# Patient Record
Sex: Female | Born: 1965 | Race: White | Hispanic: No | Smoking: Former smoker
Health system: Southern US, Community
[De-identification: ages and names within clinical notes are randomized; demographics above are authoritative.]

## PROBLEM LIST (undated history)

## (undated) DIAGNOSIS — F319 Bipolar disorder, unspecified: Secondary | ICD-10-CM

## (undated) DIAGNOSIS — F32A Depression, unspecified: Secondary | ICD-10-CM

## (undated) DIAGNOSIS — F419 Anxiety disorder, unspecified: Secondary | ICD-10-CM

## (undated) DIAGNOSIS — F329 Major depressive disorder, single episode, unspecified: Secondary | ICD-10-CM

## (undated) DIAGNOSIS — F909 Attention-deficit hyperactivity disorder, unspecified type: Secondary | ICD-10-CM

## (undated) HISTORY — PX: APPENDECTOMY: SHX54

## (undated) HISTORY — DX: Major depressive disorder, single episode, unspecified: F32.9

## (undated) HISTORY — DX: Bipolar disorder, unspecified: F31.9

## (undated) HISTORY — DX: Anxiety disorder, unspecified: F41.9

## (undated) HISTORY — DX: Depression, unspecified: F32.A

## (undated) HISTORY — DX: Attention-deficit hyperactivity disorder, unspecified type: F90.9

---

## 2001-12-24 ENCOUNTER — Encounter: Payer: Self-pay | Admitting: Emergency Medicine

## 2001-12-24 ENCOUNTER — Emergency Department (HOSPITAL_COMMUNITY): Admission: EM | Admit: 2001-12-24 | Discharge: 2001-12-24 | Payer: Self-pay | Admitting: Emergency Medicine

## 2002-01-03 ENCOUNTER — Emergency Department (HOSPITAL_COMMUNITY): Admission: EM | Admit: 2002-01-03 | Discharge: 2002-01-03 | Payer: Self-pay | Admitting: Emergency Medicine

## 2004-05-06 ENCOUNTER — Ambulatory Visit: Payer: Self-pay | Admitting: Internal Medicine

## 2004-06-21 ENCOUNTER — Emergency Department: Payer: Self-pay | Admitting: Emergency Medicine

## 2004-06-21 IMAGING — CT CT ABD-PELV W/ CM
1 of 2 series · 15 of 32 positions shown, 19 images · non-contrast
Comparison: none

REASON FOR EXAM: Abdominal pain. IV and oral contrast. Patient in rm 11
COMMENTS:

[Series 2: appendicitis · axial · 0.71mm/px · z∈[-586,-186]mm · 15 of 145 slices shown, 19 images]
[im 6/145  soft-tissue]
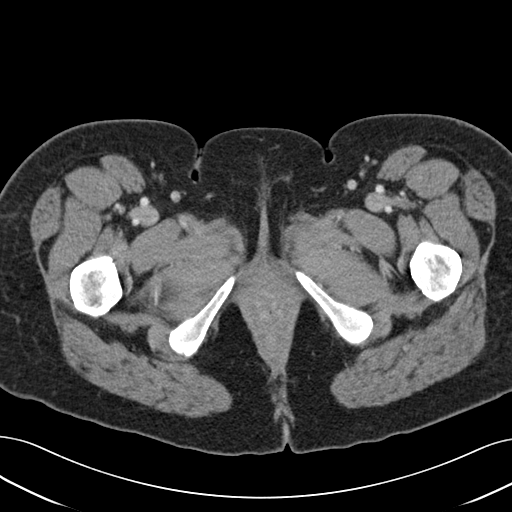
[im 6/145  bone]
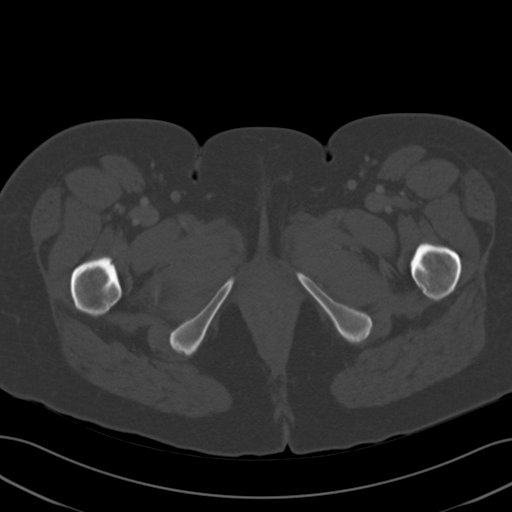
[im 18/145  soft-tissue]
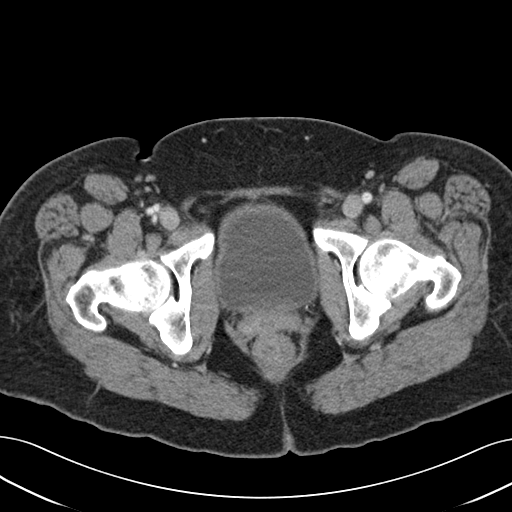
[im 29/145  soft-tissue]
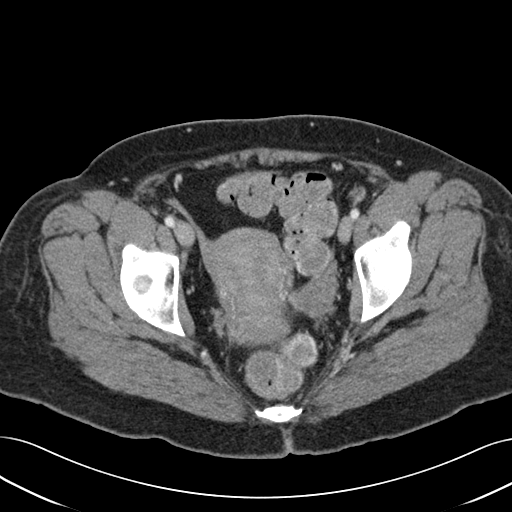
[im 41/145  soft-tissue]
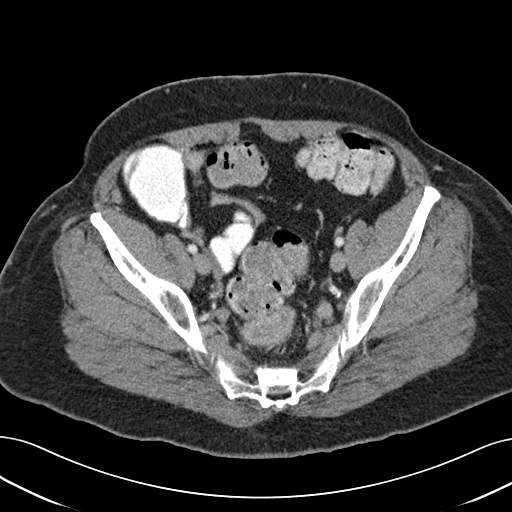
[im 52/145  soft-tissue]
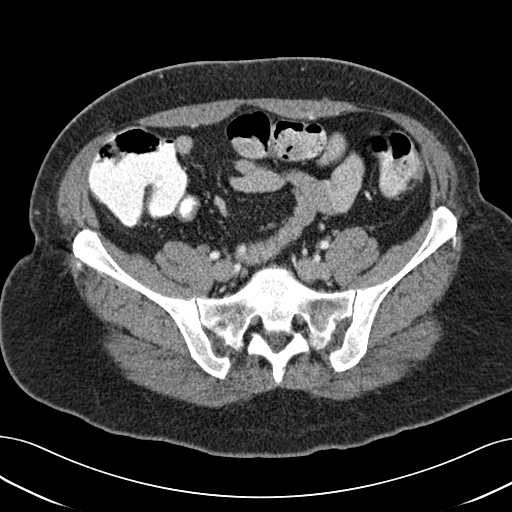
[im 64/145  soft-tissue]
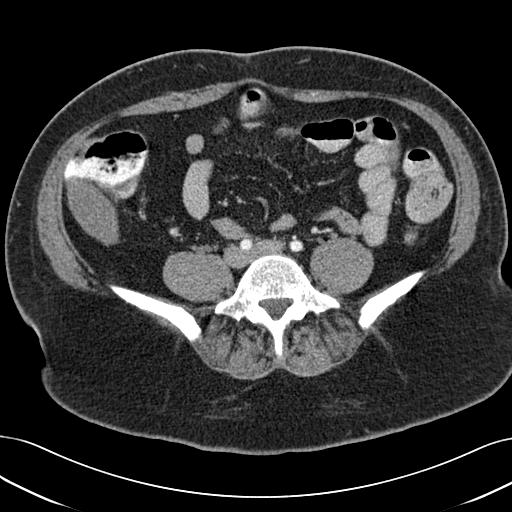
[im 75/145  soft-tissue]
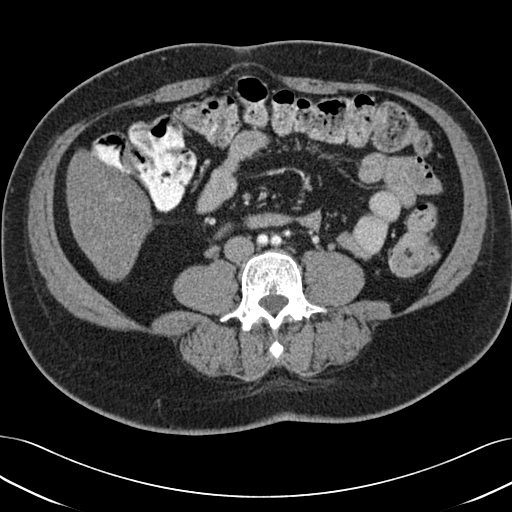
[im 81/145  soft-tissue]
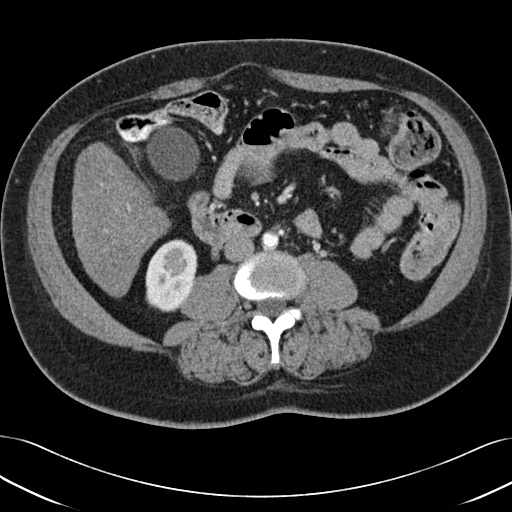
[im 93/145  soft-tissue]
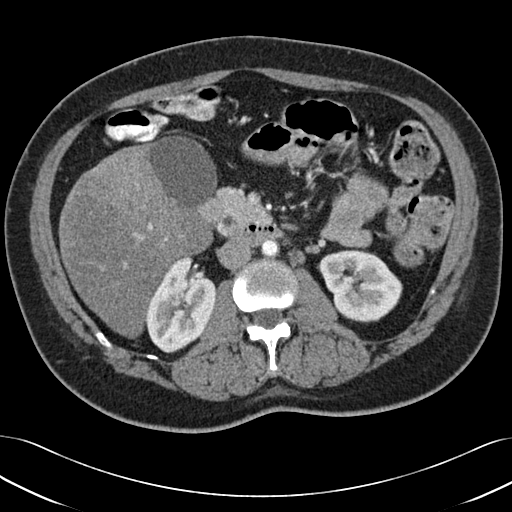
[im 93/145  bone]
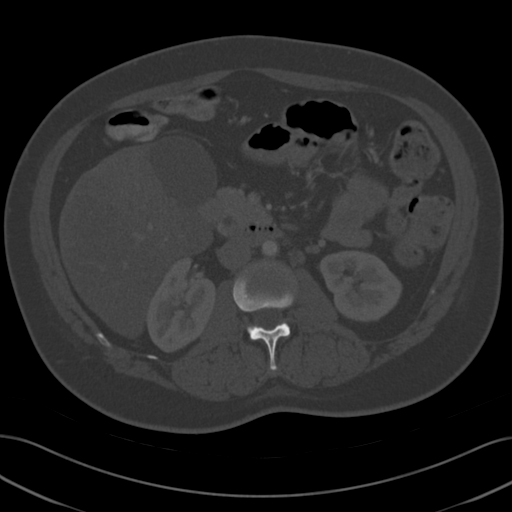
[im 104/145  soft-tissue]
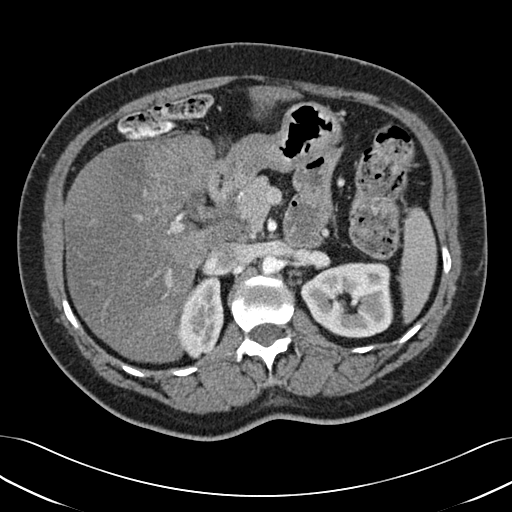
[im 116/145  soft-tissue]
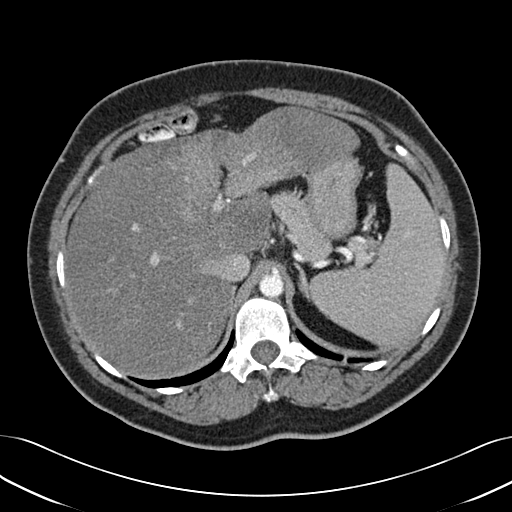
[im 121/145  lung]
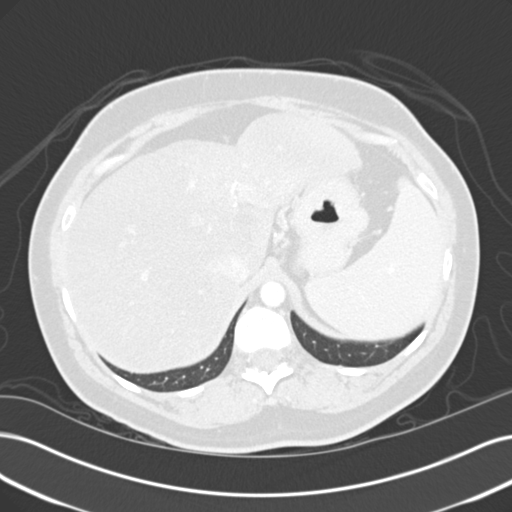
[im 127/145  soft-tissue]
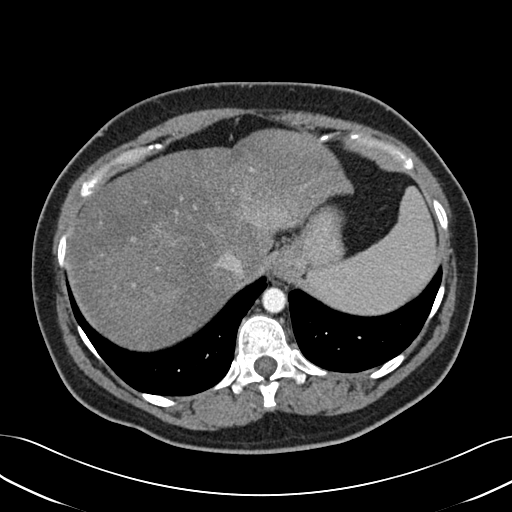
[im 127/145  lung]
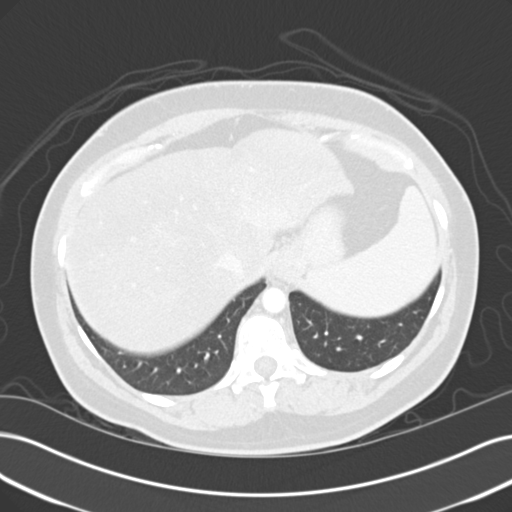
[im 133/145  lung]
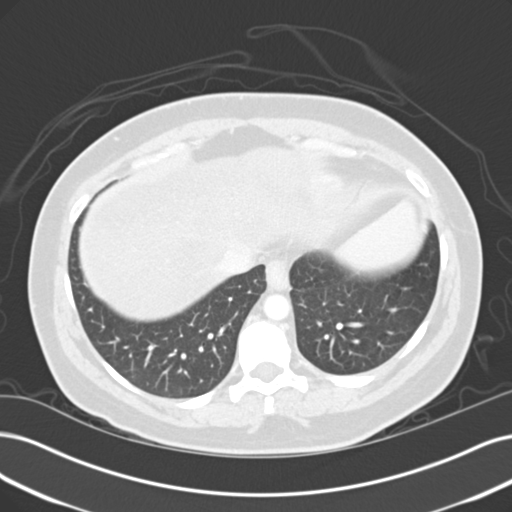
[im 139/145  soft-tissue]
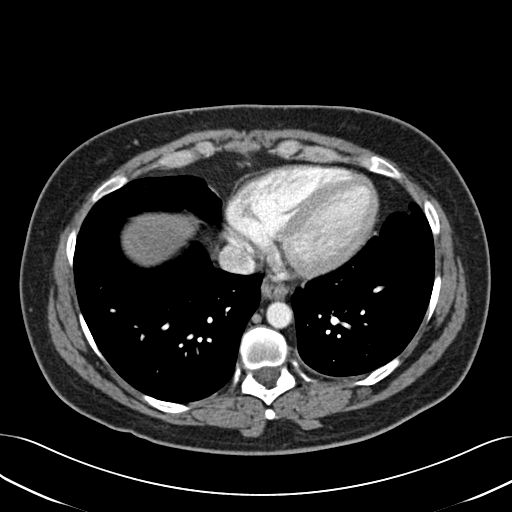
[im 139/145  lung]
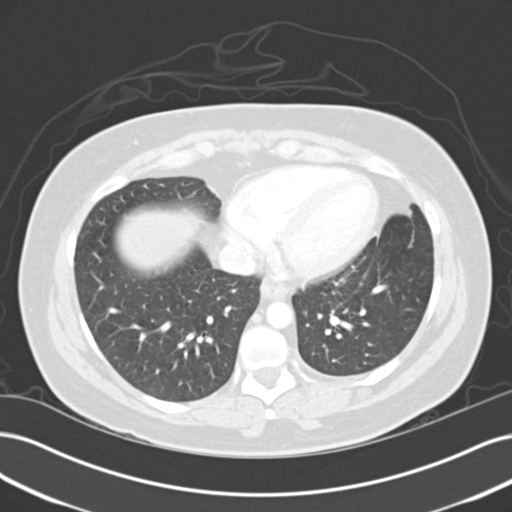

[15 of 32 positions shown; findings below may reference images not displayed]

PROCEDURE:     CT  - CT ABDOMEN / PELVIS  W  - [DATE]  [DATE]

RESULT:     An emergent abdominal pelvic CT scan was obtained post
intravenous and oral administration of contrast.

There is diffuse low attenuation throughout the liver which can represent
fatty infiltration of the liver.

No focal lytic lesions are noted in the liver and/or spleen.

Both kidneys excrete the contrast with no masses noted in either kidney.  No
hydronephrosis is identified.  Pancreas is within normal limits.  No
retroperitoneal adenopathy and no intraabdominal masses are seen.  The
uterus is to the RIGHT of midline.  Bilateral ovarian follicular cysts are
seen.  No fluid is noted in the cul-de-sac.

No obvious evidence of appendicitis is seen.  No fluid is noted about the
cecum.  There is no dirtiness of the fat.
IMPRESSION: Fatty infiltration of the liver, otherwise no significant abnormalities are
noted.

The report was called to the emergency room at the conclusion of the study.

## 2004-06-27 ENCOUNTER — Ambulatory Visit: Payer: Self-pay

## 2004-07-08 ENCOUNTER — Ambulatory Visit: Payer: Self-pay | Admitting: Unknown Physician Specialty

## 2004-11-13 ENCOUNTER — Ambulatory Visit: Payer: Self-pay | Admitting: Internal Medicine

## 2005-10-04 ENCOUNTER — Ambulatory Visit: Payer: Self-pay | Admitting: Obstetrics and Gynecology

## 2005-10-16 ENCOUNTER — Ambulatory Visit: Payer: Self-pay | Admitting: Obstetrics and Gynecology

## 2005-11-21 ENCOUNTER — Emergency Department: Payer: Self-pay

## 2005-11-22 ENCOUNTER — Ambulatory Visit: Payer: Self-pay | Admitting: Family Medicine

## 2006-01-14 ENCOUNTER — Emergency Department: Payer: Self-pay | Admitting: Emergency Medicine

## 2006-06-13 ENCOUNTER — Ambulatory Visit: Payer: Self-pay | Admitting: Unknown Physician Specialty

## 2006-07-13 ENCOUNTER — Ambulatory Visit: Payer: Self-pay | Admitting: Unknown Physician Specialty

## 2006-11-07 ENCOUNTER — Ambulatory Visit: Payer: Self-pay | Admitting: Internal Medicine

## 2007-07-30 ENCOUNTER — Ambulatory Visit: Payer: Self-pay | Admitting: Internal Medicine

## 2007-08-29 ENCOUNTER — Ambulatory Visit: Payer: Self-pay | Admitting: Internal Medicine

## 2008-09-29 ENCOUNTER — Ambulatory Visit: Payer: Self-pay | Admitting: Obstetrics and Gynecology

## 2009-10-04 ENCOUNTER — Ambulatory Visit: Payer: Self-pay | Admitting: Internal Medicine

## 2011-10-03 DIAGNOSIS — F4310 Post-traumatic stress disorder, unspecified: Secondary | ICD-10-CM | POA: Diagnosis not present

## 2011-10-03 DIAGNOSIS — F988 Other specified behavioral and emotional disorders with onset usually occurring in childhood and adolescence: Secondary | ICD-10-CM | POA: Diagnosis not present

## 2011-10-03 DIAGNOSIS — F909 Attention-deficit hyperactivity disorder, unspecified type: Secondary | ICD-10-CM | POA: Diagnosis not present

## 2011-11-07 DIAGNOSIS — F4310 Post-traumatic stress disorder, unspecified: Secondary | ICD-10-CM | POA: Diagnosis not present

## 2011-11-07 DIAGNOSIS — F909 Attention-deficit hyperactivity disorder, unspecified type: Secondary | ICD-10-CM | POA: Diagnosis not present

## 2012-02-06 DIAGNOSIS — F909 Attention-deficit hyperactivity disorder, unspecified type: Secondary | ICD-10-CM | POA: Diagnosis not present

## 2012-02-06 DIAGNOSIS — F4310 Post-traumatic stress disorder, unspecified: Secondary | ICD-10-CM | POA: Diagnosis not present

## 2012-05-14 DIAGNOSIS — F909 Attention-deficit hyperactivity disorder, unspecified type: Secondary | ICD-10-CM | POA: Diagnosis not present

## 2012-06-11 DIAGNOSIS — F909 Attention-deficit hyperactivity disorder, unspecified type: Secondary | ICD-10-CM | POA: Diagnosis not present

## 2012-09-10 DIAGNOSIS — F909 Attention-deficit hyperactivity disorder, unspecified type: Secondary | ICD-10-CM | POA: Diagnosis not present

## 2012-11-21 DIAGNOSIS — Z23 Encounter for immunization: Secondary | ICD-10-CM | POA: Diagnosis not present

## 2012-12-03 DIAGNOSIS — F909 Attention-deficit hyperactivity disorder, unspecified type: Secondary | ICD-10-CM | POA: Diagnosis not present

## 2013-02-24 DIAGNOSIS — F909 Attention-deficit hyperactivity disorder, unspecified type: Secondary | ICD-10-CM | POA: Diagnosis not present

## 2013-05-20 DIAGNOSIS — F4310 Post-traumatic stress disorder, unspecified: Secondary | ICD-10-CM | POA: Diagnosis not present

## 2013-05-20 DIAGNOSIS — F909 Attention-deficit hyperactivity disorder, unspecified type: Secondary | ICD-10-CM | POA: Diagnosis not present

## 2013-07-29 DIAGNOSIS — F4310 Post-traumatic stress disorder, unspecified: Secondary | ICD-10-CM | POA: Diagnosis not present

## 2013-07-29 DIAGNOSIS — F909 Attention-deficit hyperactivity disorder, unspecified type: Secondary | ICD-10-CM | POA: Diagnosis not present

## 2013-10-23 DIAGNOSIS — F909 Attention-deficit hyperactivity disorder, unspecified type: Secondary | ICD-10-CM | POA: Diagnosis not present

## 2013-10-23 DIAGNOSIS — F4310 Post-traumatic stress disorder, unspecified: Secondary | ICD-10-CM | POA: Diagnosis not present

## 2014-01-15 DIAGNOSIS — F909 Attention-deficit hyperactivity disorder, unspecified type: Secondary | ICD-10-CM | POA: Diagnosis not present

## 2014-01-15 DIAGNOSIS — F4310 Post-traumatic stress disorder, unspecified: Secondary | ICD-10-CM | POA: Diagnosis not present

## 2014-04-07 DIAGNOSIS — Z23 Encounter for immunization: Secondary | ICD-10-CM | POA: Diagnosis not present

## 2014-04-07 DIAGNOSIS — F909 Attention-deficit hyperactivity disorder, unspecified type: Secondary | ICD-10-CM | POA: Diagnosis not present

## 2014-04-07 DIAGNOSIS — F4310 Post-traumatic stress disorder, unspecified: Secondary | ICD-10-CM | POA: Diagnosis not present

## 2014-06-23 DIAGNOSIS — F4310 Post-traumatic stress disorder, unspecified: Secondary | ICD-10-CM | POA: Diagnosis not present

## 2014-06-23 DIAGNOSIS — F900 Attention-deficit hyperactivity disorder, predominantly inattentive type: Secondary | ICD-10-CM | POA: Diagnosis not present

## 2014-09-22 ENCOUNTER — Ambulatory Visit: Payer: Medicare Other | Admitting: Psychiatry

## 2014-11-09 ENCOUNTER — Encounter: Payer: Self-pay | Admitting: Psychiatry

## 2014-11-09 ENCOUNTER — Ambulatory Visit (INDEPENDENT_AMBULATORY_CARE_PROVIDER_SITE_OTHER): Payer: Medicare Other | Admitting: Psychiatry

## 2014-11-09 VITALS — BP 138/88 | HR 88 | Temp 97.7°F | Ht 62.0 in | Wt 141.8 lb

## 2014-11-09 DIAGNOSIS — F988 Other specified behavioral and emotional disorders with onset usually occurring in childhood and adolescence: Secondary | ICD-10-CM | POA: Insufficient documentation

## 2014-11-09 DIAGNOSIS — F902 Attention-deficit hyperactivity disorder, combined type: Secondary | ICD-10-CM

## 2014-11-09 DIAGNOSIS — F411 Generalized anxiety disorder: Secondary | ICD-10-CM

## 2014-11-09 DIAGNOSIS — F4310 Post-traumatic stress disorder, unspecified: Secondary | ICD-10-CM | POA: Insufficient documentation

## 2014-11-09 MED ORDER — CITALOPRAM HYDROBROMIDE 20 MG PO TABS: 20.0000 mg | ORAL_TABLET | Freq: Two times a day (BID) | ORAL | Status: DC

## 2014-11-09 MED ORDER — AMPHETAMINE-DEXTROAMPHET ER 30 MG PO CP24: 30.0000 mg | ORAL_CAPSULE | Freq: Two times a day (BID) | ORAL | Status: DC

## 2014-11-09 NOTE — Progress Notes (Signed)
[facility] MD Progress Note  11/09/2014 5:55 PM Karina Cervantes  MRN:  [id number] Subjective:  Patient reports that she is feeling well. No new complaints. Reports that her attention and focus of been stable and adequate. She is not having any side effects. Appetite remains normal. Sleep patterns normal. Mood has been stable without any lability. Anxiety is under good control. Functioning well at home. Principal Problem: @PPROB @ Diagnosis:  ADHD, generalized anxiety disorder Patient Active Problem List   Diagnosis Date Noted  . 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 ADD (attention deficit disorder) [F90.9] 11/09/2014   Total Time spent with patient: 30 minutes   Past Medical History:  Past Medical History  Diagnosis Date  . Anxiety   . ADHD (attention deficit hyperactivity disorder)   . Bipolar disorder   . Depression     Past Surgical History  Procedure Laterality Date  . Appendectomy     Family History:  Family History  Problem Relation Age of Onset  . Stroke Mother   . Hypertension Mother   . Anxiety disorder Mother   . Depression Mother   . Heart attack Father   . Hypertension Father   . Bipolar disorder Sister   . Heart attack Brother   . Alcohol abuse Brother   . Drug abuse Brother   . Heart attack Brother   . Alcohol abuse Brother   . Drug abuse Brother    Social History:  History  Alcohol Use No     History  Drug Use No    History   Social History  . Marital Status: Divorced    Spouse Name: N/A  . Number of Children: N/A  . Years of Education: N/A   Social History Main Topics  . Smoking status: Current Every Day Smoker -- 0.50 packs/day    Types: Cigarettes    Start date: 11/09/1994  . Smokeless tobacco: Never Used  . Alcohol Use: No  . Drug Use: No  . Sexual Activity: Yes    Birth Control/ Protection: Condom   Other Topics Concern  . None   Social History Narrative  . None   Additional History:    Sleep: Good  Appetite:   Good   Assessment: Patient appears to be doing well tolerating medicine well. No sign of any medication abuse or misuse. Vital signs all look normal. Blood pressure is good. Patient is not showing any signs of any acute dangerousness. Musculoskeletal: Strength & Muscle Tone: within normal limits Gait & Station: normal Patient leans: N/A   Psychiatric Specialty Exam: Physical Exam  Constitutional: She appears well-developed and well-nourished.  HENT:  Head: Normocephalic and atraumatic.  Eyes: Conjunctivae are normal. Pupils are equal, round, and reactive to light.  Neck: Normal range of motion.  Cardiovascular: Normal heart sounds.   Respiratory: Effort normal.  GI: Soft.  Musculoskeletal: Normal range of motion.  Neurological: She is alert.  Skin: Skin is warm and dry.  Psychiatric: She has a normal mood and affect. Her speech is normal and behavior is normal. Judgment and thought content normal. Cognition and memory are normal.    Review of Systems  Constitutional: Negative.   HENT: Negative.   Eyes: Negative.   Respiratory: Negative.   Cardiovascular: Negative.   Gastrointestinal: Negative.   Musculoskeletal: Negative.   Skin: Negative.   Neurological: Negative.   Psychiatric/Behavioral: Negative.     Blood pressure 138/88, pulse 88, temperature 97.7 F (36.5 C), temperature source Tympanic, height 5\' 2"  (1.575 m), weight 141  lb 12.8 oz (64.32 kg), SpO2 97 %.Body mass index is 25.93 kg/(m^2).  General Appearance: Casual  Eye Contact::  Good  Speech:  Clear and Coherent  Volume:  Normal  Mood:  Euthymic  Affect:  Appropriate  Thought Process:  Goal Directed  Orientation:  Full (Time, Place, and Person)  Thought Content:  Negative  Suicidal Thoughts:  No  Homicidal Thoughts:  No  Memory:  Immediate;   Good Recent;   Good Remote;   Good  Judgement:  Intact  Insight:  Fair  Psychomotor Activity:  Normal  Concentration:  Good  Recall:  Good  Fund of  Knowledge:Good  Language: Good  Akathisia:  No  Handed:  Right  AIMS (if indicated):     Assets:  Communication Skills Desire for Improvement Physical Health Social Support  ADL's:  Intact  Cognition: WNL  Sleep:        Current Medications: Current Outpatient Prescriptions  Medication Sig Dispense Refill  . amphetamine-dextroamphetamine (ADDERALL XR) 30 MG 24 hr capsule Take 1 capsule (30 mg total) by mouth 2 (two) times daily. 60 capsule 0  . citalopram (CELEXA) 20 MG tablet Take 1 tablet (20 mg total) by mouth 2 (two) times daily. 60 tablet 0   No current facility-administered medications for this visit.    Lab Results: No results found for this or any previous visit (from the past 48 hour(s)).  Physical Findings: AIMS:  , ,  ,  ,    CIWA:    COWS:     Treatment Plan Summary: Medication management and Plan Patient will be continued on current medication dosage of Adderall XR 30 mg twice a day and citalopram 20 mg per day. Reviewed side effects of medication. Supportive counseling. Reviewed importance of staying physically healthy and get plenty of sleep. Renewed all medications. 3 prescriptions were written for the Adderall each one for 1 month with them dated so that they can be filled at the appropriate time. Follow-up in another 3 months.   Medical Decision Making:  Established Problem, Stable/Improving (1), Review of Psycho-Social Stressors (1) and Review of Medication Regimen & Side Effects (2)     Tyvion Edmondson 11/09/2014, 5:55 PM

## 2014-12-06 ENCOUNTER — Other Ambulatory Visit: Payer: Self-pay | Admitting: Psychiatry

## 2014-12-10 ENCOUNTER — Other Ambulatory Visit: Payer: Self-pay

## 2014-12-10 NOTE — Telephone Encounter (Signed)
request for a refill on citalopram  . pt last seen on 11-09-14. no follow appt was made.

## 2014-12-11 MED ORDER — CITALOPRAM HYDROBROMIDE 20 MG PO TABS
20.0000 mg | ORAL_TABLET | Freq: Two times a day (BID) | ORAL | Status: DC
Start: 2014-12-11 — End: 2015-04-26

## 2015-01-07 DIAGNOSIS — Z23 Encounter for immunization: Secondary | ICD-10-CM | POA: Diagnosis not present

## 2015-02-04 ENCOUNTER — Encounter: Payer: Self-pay | Admitting: Psychiatry

## 2015-02-04 ENCOUNTER — Ambulatory Visit (INDEPENDENT_AMBULATORY_CARE_PROVIDER_SITE_OTHER): Payer: Medicare Other | Admitting: Psychiatry

## 2015-02-04 VITALS — BP 138/88 | HR 82 | Temp 97.5°F | Ht 63.0 in | Wt 138.2 lb

## 2015-02-04 DIAGNOSIS — F902 Attention-deficit hyperactivity disorder, combined type: Secondary | ICD-10-CM | POA: Diagnosis not present

## 2015-02-04 DIAGNOSIS — F411 Generalized anxiety disorder: Secondary | ICD-10-CM

## 2015-02-04 MED ORDER — CITALOPRAM HYDROBROMIDE 20 MG PO TABS: 20.0000 mg | ORAL_TABLET | Freq: Two times a day (BID) | ORAL | Status: DC

## 2015-02-04 MED ORDER — AMPHETAMINE-DEXTROAMPHET ER 30 MG PO CP24: 30.0000 mg | ORAL_CAPSULE | Freq: Two times a day (BID) | ORAL | Status: DC

## 2015-02-09 NOTE — Progress Notes (Signed)
[facility] MD Progress Note  02/09/2015 4:26 PM Karina Cervantes  MRN:  [id number] Subjective:  Follow-up for this 49 year old woman with a history of ADHD and anxiety. Patient reports that she is moving. This was necessary because of her public housing situation. She is not moving far away and seems satisfied about it area she also has recently started dating a man she met and feels that is a positive thing as well. Mood has been good. Concentration and focus remain adequate. Patient has had no psychotic features no suicidality no dangerous behavior. On affect she looks appropriate and calm. Principal Problem: _0 @ Diagnosis:   Patient Active Problem List   Diagnosis Date Noted  . 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 ADD (attention deficit disorder) [F90.9] 11/09/2014   Total Time spent with patient: 20 minutes  Past Psychiatric History: history of ADHD and anxiety. No suicide attempts. No hospitalization.  Past Medical History:  Past Medical History  Diagnosis Date  . Anxiety   . ADHD (attention deficit hyperactivity disorder)   . Bipolar disorder ([place])   . Depression     Past Surgical History  Procedure Laterality Date  . Appendectomy     Family History:  Family History  Problem Relation Age of Onset  . Stroke Mother   . Hypertension Mother   . Anxiety disorder Mother   . Depression Mother   . Heart attack Father   . Hypertension Father   . Bipolar disorder Sister   . Heart attack Brother   . Alcohol abuse Brother   . Drug abuse Brother   . Heart attack Brother   . Alcohol abuse Brother   . Drug abuse Brother    Family Psychiatric  History: positive for anxiety Social History:  History  Alcohol Use No     History  Drug Use No    Social History   Social History  . Marital Status: Divorced    Spouse Name: N/A  . Number of Children: N/A  . Years of Education: N/A   Social History Main Topics  . Smoking status: Current Every Day Smoker -- 0.50 packs/day     Types: Cigarettes    Start date: 11/09/1994  . Smokeless tobacco: Never Used  . Alcohol Use: No  . Drug Use: No  . Sexual Activity: Yes    Birth Control/ Protection: Condom   Other Topics Concern  . None   Social History Narrative   Additional Social History:                         Sleep: Fair  Appetite:  Fair  Current Medications: Current Outpatient Prescriptions  Medication Sig Dispense Refill  . amphetamine-dextroamphetamine (ADDERALL XR) 30 MG 24 hr capsule Take 1 capsule (30 mg total) by mouth 2 (two) times daily. 60 capsule 0  . citalopram (CELEXA) 20 MG tablet Take 1 tablet (20 mg total) by mouth 2 (two) times daily. 60 tablet 6  . citalopram (CELEXA) 20 MG tablet Take 1 tablet (20 mg total) by mouth 2 (two) times daily. (Patient not taking: Reported on 02/04/2015) 60 tablet 0   No current facility-administered medications for this visit.    Lab Results: No results found for this or any previous visit (from the past 48 hour(s)).  Physical Findings: AIMS:  , ,  ,  ,    CIWA:    COWS:     Musculoskeletal: Strength & Muscle Tone: within normal limits Gait & Station:  normal Patient leans: N/A  Psychiatric Specialty Exam: ROS  Blood pressure 138/88, pulse 82, temperature 97.5 F (36.4 C), temperature source Tympanic, height _0  (1.6 m), weight 138 lb 3.2 oz (62.687 kg), SpO2 95 %.Body mass index is 24.49 kg/(m^2).  General Appearance: Fairly Groomed  Engineer, water::  Good  Speech:  Clear and Coherent  Volume:  Normal  Mood:  Euthymic  Affect:  Full Range  Thought Process:  Goal Directed  Orientation:  Full (Time, Place, and Person)  Thought Content:  Negative  Suicidal Thoughts:  No  Homicidal Thoughts:  No  Memory:  Immediate;   Good Recent;   Good Remote;   Good  Judgement:  Intact  Insight:  Fair  Psychomotor Activity:  Normal  Concentration:  Good  Recall:  Good  Fund of Knowledge:Good  Language: Good  Akathisia:  No  Handed:   Right  AIMS (if indicated):     Assets:  Communication Skills Desire for Improvement Financial Resources/Insurance Housing Physical Health Resilience  ADL's:  Intact  Cognition: WNL  Sleep:      Treatment Plan Summary: Medication management and Plan refill Adderall. Refill citalopram. Vital signs stable. No sign of side effects of medicine. Supportive counseling. Review use of medicine. Follow-up in another 3 months. She agrees to the plan.  [name] 02/09/2015, 4:26 PM

## 2015-02-21 ENCOUNTER — Encounter: Payer: Self-pay | Admitting: Emergency Medicine

## 2015-02-21 ENCOUNTER — Emergency Department
Admission: EM | Admit: 2015-02-21 | Discharge: 2015-02-21 | Discharge status: Against Medical Advice | Payer: Medicare Other | Attending: Emergency Medicine | Admitting: Emergency Medicine

## 2015-02-21 DIAGNOSIS — R4182 Altered mental status, unspecified: Secondary | ICD-10-CM | POA: Diagnosis not present

## 2015-02-21 DIAGNOSIS — R0989 Other specified symptoms and signs involving the circulatory and respiratory systems: Secondary | ICD-10-CM | POA: Insufficient documentation

## 2015-02-21 DIAGNOSIS — R509 Fever, unspecified: Secondary | ICD-10-CM | POA: Diagnosis not present

## 2015-02-21 DIAGNOSIS — R0602 Shortness of breath: Secondary | ICD-10-CM | POA: Insufficient documentation

## 2015-02-21 DIAGNOSIS — F17210 Nicotine dependence, cigarettes, uncomplicated: Secondary | ICD-10-CM | POA: Insufficient documentation

## 2015-02-21 LAB — COMPREHENSIVE METABOLIC PANEL
ALBUMIN: 3.9 g/dL (ref 3.5–5.0)
ALT: 21 U/L (ref 14–54)
ANION GAP: 7 (ref 5–15)
AST: 21 U/L (ref 15–41)
Alkaline Phosphatase: 83 U/L (ref 38–126)
BUN: 9 mg/dL (ref 6–20)
CO2: 27 mmol/L (ref 22–32)
Calcium: 9.1 mg/dL (ref 8.9–10.3)
Chloride: 104 mmol/L (ref 101–111)
Creatinine, Ser: 0.82 mg/dL (ref 0.44–1.00)
GFR calc Af Amer: >60 mL/min (ref >60)
GFR calc non Af Amer: >60 mL/min (ref >60)
GLUCOSE: 182 mg/dL — AB (ref 65–99)
POTASSIUM: 4 mmol/L (ref 3.5–5.1)
SODIUM: 138 mmol/L (ref 135–145)
Total Bilirubin: 1.1 mg/dL (ref 0.3–1.2)
Total Protein: 7.8 g/dL (ref 6.5–8.1)

## 2015-02-21 LAB — CBC
HEMATOCRIT: 47.3 % — AB (ref 35.0–47.0)
Hemoglobin: 15.6 g/dL (ref 12.0–16.0)
MCH: 30.5 pg (ref 26.0–34.0)
MCHC: 33.1 g/dL (ref 32.0–36.0)
MCV: 92.2 fL (ref 80.0–100.0)
PLATELETS: 101 10*3/uL — AB (ref 150–440)
RBC: 5.13 MIL/uL (ref 3.80–5.20)
RDW: 12.4 % (ref 11.5–14.5)
WBC: 5 10*3/uL (ref 3.6–11.0)

## 2015-02-21 NOTE — ED Notes (Signed)
Per family she developed chest congestion  And having some diff breathing   Now more lethargic also has had some fever

## 2015-02-21 NOTE — ED Notes (Signed)
Attempted to call x1

## 2015-02-22 ENCOUNTER — Ambulatory Visit
Admission: EM | Admit: 2015-02-22 | Discharge: 2015-02-22 | Disposition: A | Discharge status: Home / Self Care | Payer: Medicaid Other | Attending: Family Medicine | Admitting: Family Medicine

## 2015-02-22 ENCOUNTER — Telehealth: Payer: Self-pay | Admitting: Emergency Medicine

## 2015-02-22 DIAGNOSIS — D691 Qualitative platelet defects: Secondary | ICD-10-CM | POA: Diagnosis not present

## 2015-02-22 DIAGNOSIS — J209 Acute bronchitis, unspecified: Secondary | ICD-10-CM | POA: Diagnosis not present

## 2015-02-22 DIAGNOSIS — Z720 Tobacco use: Secondary | ICD-10-CM

## 2015-02-22 MED ORDER — AZITHROMYCIN 250 MG PO TABS: ORAL_TABLET | ORAL | Status: DC

## 2015-02-22 MED ORDER — BECLOMETHASONE DIPROPIONATE 80 MCG/ACT IN AERS: 2.0000 | INHALATION_SPRAY | RESPIRATORY_TRACT | Status: DC | PRN

## 2015-02-22 MED ORDER — IPRATROPIUM-ALBUTEROL 0.5-2.5 (3) MG/3ML IN SOLN
3.0000 mL | Freq: Once | RESPIRATORY_TRACT | Status: AC
  Administered 2015-02-22: 3 mL via RESPIRATORY_TRACT

## 2015-02-22 MED ORDER — ALBUTEROL SULFATE HFA 108 (90 BASE) MCG/ACT IN AERS: 2.0000 | INHALATION_SPRAY | Freq: Four times a day (QID) | RESPIRATORY_TRACT | Status: DC | PRN

## 2015-02-22 NOTE — ED Notes (Signed)
Called patient due to lwot to inquire about condition and follow up plans. Says she has no pcp and doesn't trust urgent cares.  i told her she could come here, as it is important she have exam. Says says she is still sick with cough and fever.  Pt has slurred speech on phone.

## 2015-02-22 NOTE — Discharge Instructions (Signed)
Acute Bronchitis Bronchitis is when the airways that extend from the windpipe into the lungs get red, puffy, and painful (inflamed). Bronchitis often causes thick spit (mucus) to develop. This leads to a cough. A cough is the most common symptom of bronchitis. In acute bronchitis, the condition usually begins suddenly and goes away over time (usually in 2 weeks). Smoking, allergies, and asthma can make bronchitis worse. Repeated episodes of bronchitis may cause more lung problems. HOME CARE  Rest.  Drink enough fluids to keep your pee (urine) clear or pale yellow (unless you need to limit fluids as told by your doctor).  Only take over-the-counter or prescription medicines as told by your doctor.  Avoid smoking and secondhand smoke. These can make bronchitis worse. If you are a smoker, think about using nicotine gum or skin patches. Quitting smoking will help your lungs heal faster.  Reduce the chance of getting bronchitis again by:  Washing your hands often.  Avoiding people with cold symptoms.  Trying not to touch your hands to your mouth, nose, or eyes.  Follow up with your doctor as told. GET HELP IF: Your symptoms do not improve after 1 week of treatment. Symptoms include:  Cough.  Fever.  Coughing up thick spit.  Body aches.  Chest congestion.  Chills.  Shortness of breath.  Sore throat. GET HELP RIGHT AWAY IF:   You have an increased fever.  You have chills.  You have severe shortness of breath.  You have bloody thick spit (sputum).  You throw up (vomit) often.  You lose too much body fluid (dehydration).  You have a severe headache.  You faint. MAKE SURE YOU:   Understand these instructions.  Will watch your condition.  Will get help right away if you are not doing well or get worse.   This information is not intended to replace advice given to you by your health care provider. Make sure you discuss any questions you have with your health care  provider.   Document Released: 09/13/2007 Document Revised: 11/27/2012 Document Reviewed: 09/17/2012 Elsevier Interactive Patient Education 2016 [employer]  Platelet Count Test WHY AM I HAVING THIS TEST? The platelet count test is performed to obtain a measure of how many platelets you have within a specific amount (volume) of blood. Platelets are specialized cells made in your bone marrow that gather at the site of tissue injury to stop bleeding. Most of the platelets in your body can be found in your bloodstream. Fewer platelets are stored in your liver and spleen. Your health care provider may want you to have this test if you have a rash or other medical condition that suggests that you have a low platelet count. This may include one of these conditions that cause excess bleeding or delayed blood clotting, such as:  Petechiae. These are small hemorrhages in the skin that cause a rash. The rash appears as a collection of red-purple pinprick-sized dots.  Heavy menstrual bleeding, if you are female.  Thrombocytopenia. This is a condition in which you have a low platelet count.  Bone marrow failure. This test may also be used to monitor treatment for those conditions. WHAT KIND OF SAMPLE IS TAKEN? A blood sample is required for this test. It is usually collected by inserting a needle into a vein or by sticking a finger with a small needle.  HOW DO I PREPARE FOR THE TEST? There is no preparation or fasting required for this test. WHAT ARE THE REFERENCE RANGES? Reference  ranges are considered healthy ranges established after testing a large group of healthy people. Reference ranges may vary among different people, labs, and hospitals. It is your responsibility to obtain your test results. Ask the lab or department performing the test when and how you will get your results.   Adult or elderly: 150,000-400,000 per microliter.  Child: 150,000-400,000 per microliter.  Infant:  200,000-475,000 per microliter.  Premature infant: 100,000-300,000 per microliter.  Newborn: 150,000-300,000 per microliter. WHAT DO THE RESULTS MEAN? Results that are higher than the reference range can indicate a number of health conditions. These may include:  Certain types of cancer, such as leukemia or lymphoma.  Polycythemia vera. This is a condition in which the bone marrow is producing excess amounts of all cell types, including platelets.  Postsplenectomy syndrome. This is a condition that can occur after surgery that is done to remove the spleen (splenectomy).  Rheumatoid arthritis.  Iron-deficiency anemia. Results that are lower than the reference range can also indicate a number of health conditions. These may include:  Hypersplenism. This is a condition in which the spleen is breaking down platelets faster than normal.  Bleeding (hemorrhage).  Immune thrombocytopenia.  Cancer or chemotherapy treatments for cancers such as leukemia.  Thrombotic thrombocytopenia.  HELLP syndrome.  Abnormal thyroid gland function, such as in Graves disease.  Certain inherited disorders that cause a decreased platelet count.  Disseminated intravascular coagulation (DIC). This is a condition in which abnormal blood-clotting processes occur.  Systemic lupus erythematosus (SLE).  Certain types of anemia, such as pernicious and hemolytic anemias.  Infection. Talk with your health care provider to discuss your results, treatment options, and if necessary, the need for more tests. Talk with your health care provider if you have any questions about your results.   This information is not intended to replace advice given to you by your health care provider. Make sure you discuss any questions you have with your health care provider.   Document Released: 04/21/2004 Document Revised: 04/17/2014 Document Reviewed: 08/20/2013 Elsevier Interactive Patient Education 2016 [employer].  Steps to Quit Smoking  Smoking tobacco can be harmful to your health and can affect almost every organ in your body. Smoking puts you, and those around you, at risk for developing many serious chronic diseases. Quitting smoking is difficult, but it is one of the best things that you can do for your health. It is never too late to quit. WHAT ARE THE BENEFITS OF QUITTING SMOKING? When you quit smoking, you lower your risk of developing serious diseases and conditions, such as:  Lung cancer or lung disease, such as COPD.  Heart disease.  Stroke.  Heart attack.  Infertility.  Osteoporosis and bone fractures. Additionally, symptoms such as coughing, wheezing, and shortness of breath may get better when you quit. You may also find that you get sick less often because your body is stronger at fighting off colds and infections. If you are pregnant, quitting smoking can help to reduce your chances of having a baby of low birth weight. HOW DO I GET READY TO QUIT? When you decide to quit smoking, create a plan to make sure that you are successful. Before you quit:  Pick a date to quit. Set a date within the next two weeks to give you time to prepare.  Write down the reasons why you are quitting. Keep this list in places where you will see it often, such as on your bathroom mirror or in your car or  wallet.  Identify the people, places, things, and activities that make you want to smoke (triggers) and avoid them. Make sure to take these actions:  Throw away all cigarettes at home, at work, and in your car.  Throw away smoking accessories, such as Set designer.  Clean your car and make sure to empty the ashtray.  Clean your home, including curtains and carpets.  Tell your family, friends, and coworkers that you are quitting. Support from your loved ones can make quitting easier.  Talk with your health care provider about your options for quitting smoking.  Find out what  treatment options are covered by your health insurance. WHAT STRATEGIES CAN I USE TO QUIT SMOKING?  Talk with your healthcare provider about different strategies to quit smoking. Some strategies include:  Quitting smoking altogether instead of gradually lessening how much you smoke over a period of time. Research shows that quitting "cold [place]" is more successful than gradually quitting.  Attending in-person counseling to help you build problem-solving skills. You are more likely to have success in quitting if you attend several counseling sessions. Even short sessions of 10 minutes can be effective.  Finding resources and support systems that can help you to quit smoking and remain smoke-free after you quit. These resources are most helpful when you use them often. They can include:  Online chats with a Veterinary surgeon.  Telephone quitlines.  Printed Materials engineer.  Support groups or group counseling.  Text messaging programs.  Mobile phone applications.  Taking medicines to help you quit smoking. (If you are pregnant or breastfeeding, talk with your health care provider first.) Some medicines contain nicotine and some do not. Both types of medicines help with cravings, but the medicines that include nicotine help to relieve withdrawal symptoms. Your health care provider may recommend:  Nicotine patches, gum, or lozenges.  Nicotine inhalers or sprays.  Non-nicotine medicine that is taken by mouth. Talk with your health care provider about combining strategies, such as taking medicines while you are also receiving in-person counseling. Using these two strategies together makes you more likely to succeed in quitting than if you used either strategy on its own. If you are pregnant or breastfeeding, talk with your health care provider about finding counseling or other support strategies to quit smoking. Do not take medicine to help you quit smoking unless told to do so by your health  care provider. WHAT THINGS CAN I DO TO MAKE IT EASIER TO QUIT? Quitting smoking might feel overwhelming at first, but there is a lot that you can do to make it easier. Take these important actions:  Reach out to your family and friends and ask that they support and encourage you during this time. Call telephone quitlines, reach out to support groups, or work with a counselor for support.  Ask people who smoke to avoid smoking around you.  Avoid places that trigger you to smoke, such as bars, parties, or smoke-break areas at work.  Spend time around people who do not smoke.  Lessen stress in your life, because stress can be a smoking trigger for some people. To lessen stress, try:  Exercising regularly.  Deep-breathing exercises.  Yoga.  Meditating.  Performing a body scan. This involves closing your eyes, scanning your body from head to toe, and noticing which parts of your body are particularly tense. Purposefully relax the muscles in those areas.  Download or purchase mobile phone or tablet apps (applications) that can help you stick to your  quit plan by providing reminders, tips, and encouragement. There are many free apps, such as QuitGuide from the [employer] developer for Disease Control and Prevention). You can find other support for quitting smoking (smoking cessation) through smokefree.gov and other websites. HOW WILL I FEEL WHEN I QUIT SMOKING? Within the first 24 hours of quitting smoking, you may start to feel some withdrawal symptoms. These symptoms are usually most noticeable 2-3 days after quitting, but they usually do not last beyond 2-3 weeks. Changes or symptoms that you might experience include:  Mood swings.  Restlessness, anxiety, or irritation.  Difficulty concentrating.  Dizziness.  Strong cravings for sugary foods in addition to nicotine.  Mild weight gain.  Constipation.  Nausea.  Coughing or a sore throat.  Changes in how your medicines work in your  body.  A depressed mood.  Difficulty sleeping (insomnia). After the first 2-3 weeks of quitting, you may start to notice more positive results, such as:  Improved sense of smell and taste.  Decreased coughing and sore throat.  Slower heart rate.  Lower blood pressure.  Clearer skin.  The ability to breathe more easily.  Fewer sick days. Quitting smoking is very challenging for most people. Do not get discouraged if you are not successful the first time. Some people need to make many attempts to quit before they achieve long-term success. Do your best to stick to your quit plan, and talk with your health care provider if you have any questions or concerns.   This information is not intended to replace advice given to you by your health care provider. Make sure you discuss any questions you have with your health care provider.   Document Released: 03/21/2001 Document Revised: 08/11/2014 Document Reviewed: 08/11/2014 Elsevier Interactive Patient Education [employer]

## 2015-02-22 NOTE — ED Provider Notes (Signed)
CSN: [id number]     Arrival date & time 02/22/15  1305 History   First MD Initiated Contact with Patient 02/22/15 1516     Nurses notes were reviewed. Chief Complaint  Patient presents with  . URI    Patient is here today with URI. She's had increased dry bronchospasm for at least a week. She reports is progressively getting worse. She was seen in the ED or she went to the ED yesterday but did not wait to be seen. Apparently blood work was done which showed a low platelet count the patient had eloped before they could talk to her about the results. This note in the chart for this supervising nurse that they did call her to talk to about this but she seems to be unaware of any information from his call and seemed quite alarmed that her platelet count was low. Renal patient still smokes and she's had a history of asthma but denies having asthma attacks at least 8-10 years. States she doesn't have regular doctor this time for several reasons. She denies having  a diagnosis of COPD at this time.  (Consider location/radiation/quality/duration/timing/severity/associated sxs/prior Treatment) Patient is a 49 y.o. female presenting with URI and shortness of breath. The history is provided by the patient. No language interpreter was used.  URI Presenting symptoms: congestion, cough, fever and rhinorrhea   Severity:  Moderate Duration:  7 days Timing:  Constant Progression:  Worsening Chronicity:  New Relieved by:  Nothing Ineffective treatments:  None tried Associated symptoms: headaches, sneezing and wheezing   Risk factors: recent illness   Risk factors: no chronic cardiac disease, no chronic kidney disease, no chronic respiratory disease and no immunosuppression   Shortness of Breath Severity:  Moderate Duration:  1 week Timing:  Constant Progression:  Worsening Context: activity, smoke exposure, strong odors, URI and weather changes   Relieved by:  Nothing Worsened by:  Smoke  exposure Ineffective treatments:  None tried Associated symptoms: cough, fever, headaches and wheezing   Associated symptoms: no abdominal pain, no claudication and no sputum production   Fever:    Timing:  Intermittent   Temp source:  Subjective Risk factors: tobacco use   Risk factors: no recent alcohol use, no family hx of DVT, no hx of cancer, no hx of PE/DVT, no obesity and no prolonged immobilization     Past Medical History  Diagnosis Date  . Anxiety   . ADHD (attention deficit hyperactivity disorder)   . Bipolar disorder (HCC)   . Depression    Past Surgical History  Procedure Laterality Date  . Appendectomy     Family History  Problem Relation Age of Onset  . Stroke Mother   . Hypertension Mother   . Anxiety disorder Mother   . Depression Mother   . Heart attack Father   . Hypertension Father   . Bipolar disorder Sister   . Heart attack Brother   . Alcohol abuse Brother   . Drug abuse Brother   . Heart attack Brother   . Alcohol abuse Brother   . Drug abuse Brother    Social History  Substance Use Topics  . Smoking status: Current Every Day Smoker -- 0.50 packs/day    Types: Cigarettes    Start date: 11/09/1994  . Smokeless tobacco: Never Used  . Alcohol Use: No   OB History    No data available     Review of Systems  Constitutional: Positive for fever.  HENT: Positive for congestion, rhinorrhea  and sneezing.   Respiratory: Positive for cough, shortness of breath and wheezing. Negative for sputum production.   Cardiovascular: Negative for claudication.  Gastrointestinal: Negative for abdominal pain.  Neurological: Positive for headaches.  All other systems reviewed and are negative.   Allergies  Penicillins  Home Medications   Prior to Admission medications   Medication Sig Start Date End Date Taking? Authorizing Provider  amphetamine-dextroamphetamine (ADDERALL XR) 30 MG 24 hr capsule Take 1 capsule (30 mg total) by mouth 2 (two) times  daily. 02/04/15  Yes Audery Amel, MD  citalopram (CELEXA) 20 MG tablet Take 1 tablet (20 mg total) by mouth 2 (two) times daily. 12/11/14  Yes Audery Amel, MD  albuterol (PROVENTIL HFA;VENTOLIN HFA) 108 (90 BASE) MCG/ACT inhaler Inhale 2 puffs into the lungs every 6 (six) hours as needed for wheezing or shortness of breath. 02/22/15   Hassan Rowan, MD  azithromycin (ZITHROMAX Z-PAK) 250 MG tablet Take 2 tablets first day and then 1 po a day for 4 days 02/22/15   Hassan Rowan, MD  beclomethasone (QVAR) 80 MCG/ACT inhaler Inhale 2 puffs into the lungs every 2 (two) hours as needed. 02/22/15   Hassan Rowan, MD  citalopram (CELEXA) 20 MG tablet Take 1 tablet (20 mg total) by mouth 2 (two) times daily. 02/04/15   Audery Amel, MD   Meds Ordered and Administered this Visit   Medications  ipratropium-albuterol (DUONEB) 0.5-2.5 (3) MG/3ML nebulizer solution 3 mL (3 mLs Nebulization Given 02/22/15 1610)    BP 192/73 mmHg  Pulse 65  Temp(Src) 97.3 F (36.3 C) (Tympanic)  Resp 18  Ht  (1.575 m)  Wt 138 lb (62.596 kg)  BMI 25.23 kg/m2  SpO2 100% No data found.   Physical Exam  Constitutional: She is oriented to person, place, and time. She appears well-developed and well-nourished.  HENT:  Head: Normocephalic and atraumatic.  Eyes: Pupils are equal, round, and reactive to light.  Neck: Normal range of motion.  Cardiovascular: Regular rhythm.   Pulmonary/Chest: No respiratory distress. She has wheezes. She has no rales.  Musculoskeletal: Normal range of motion.  Neurological: She is alert and oriented to person, place, and time. No cranial nerve deficit.  Skin: Skin is warm.  Psychiatric: She has a normal mood and affect.  Vitals reviewed.   ED Course  Procedures (including critical care time)  Labs Review Labs Reviewed - No data to display  Imaging Review No results found.   Visual Acuity Review  Right Eye Distance:   Left Eye Distance:   Bilateral Distance:     Right Eye Near:   Left Eye Near:    Bilateral Near:         MDM   1. Bronchitis, acute, with bronchospasm   2. Abnormal platelets (HCC)   3. Tobacco abuse     Patient declines oral prednisone states makes made her crazy. And that it only took a few days to make her crazy. Since she has a history of bipolar depression and other mental illness will not place on oral prednisone at this time. She wants to be on inhaler and will place him Qvar inhaler and albuterol inhaler as well. Warned that she stop smoking as well. For the acute bronchitis with acute bronchospasm will also add prescription Zithromax as well. Patient has abnormal platelet count low platelet count she had blood work done in the ED last night which did not stay to have evaluation done. According to the note from  the ED she was contact about that since she seems to have no knowledge of it now we'll give her her social support [facility] see if he is accepting new patients or Medicaid. Patient did improve after the DuoNeb treatment as O2 saturations went up to 100%.     Hassan RowanEugene Kariyah Baugh, MD 02/22/15 [phone]

## 2015-02-22 NOTE — ED Notes (Signed)
Sick since Tuesday. Started with runny nose. 2 days later + non productive cough.

## 2015-02-22 NOTE — ED Notes (Signed)
[facility] and had Labs drawn. Did not wait for results.

## 2015-02-24 DIAGNOSIS — R062 Wheezing: Secondary | ICD-10-CM | POA: Diagnosis not present

## 2015-02-24 DIAGNOSIS — J40 Bronchitis, not specified as acute or chronic: Secondary | ICD-10-CM | POA: Diagnosis not present

## 2015-02-24 DIAGNOSIS — R05 Cough: Secondary | ICD-10-CM | POA: Diagnosis not present

## 2015-04-15 ENCOUNTER — Ambulatory Visit: Payer: Medicare Other | Admitting: Psychiatry

## 2015-04-26 ENCOUNTER — Ambulatory Visit (INDEPENDENT_AMBULATORY_CARE_PROVIDER_SITE_OTHER): Payer: Medicare Other | Admitting: Psychiatry

## 2015-04-26 ENCOUNTER — Encounter: Payer: Self-pay | Admitting: Psychiatry

## 2015-04-26 VITALS — BP 142/90 | HR 89 | Temp 97.6°F | Ht 62.0 in | Wt 138.4 lb

## 2015-04-26 DIAGNOSIS — F902 Attention-deficit hyperactivity disorder, combined type: Secondary | ICD-10-CM | POA: Diagnosis not present

## 2015-04-26 MED ORDER — AMPHETAMINE-DEXTROAMPHET ER 30 MG PO CP24: 30.0000 mg | ORAL_CAPSULE | Freq: Two times a day (BID) | ORAL | Status: DC

## 2015-04-26 NOTE — Progress Notes (Signed)
[facility] MD Progress Note  04/26/2015 2:21 PM Karina Cervantes  MRN:  [id number] Subjective:  No new complaints. Mood is stable. She actually feels like the Celexa may be making her too emotionally numb. She wants to discontinue it. In fact she has already discontinued it and wants to just stay off it. No return of depression. Anxiety level stable. No suicidal ideation. Tolerating medication well without any difficulty. Concentration and focus appears to be adequate. Principal Problem: @PPROB @ Diagnosis:   Patient Active Problem List   Diagnosis Date Noted  . 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 ADD (attention deficit disorder) [F90.9] 11/09/2014   Total Time spent with patient: 30 minutes  Past Psychiatric History: Past history of ADHD and mood instability  Past Medical History:  Past Medical History  Diagnosis Date  . Anxiety   . ADHD (attention deficit hyperactivity disorder)   . Bipolar disorder (HCC)   . Depression     Past Surgical History  Procedure Laterality Date  . Appendectomy     Family History:  Family History  Problem Relation Age of Onset  . Stroke Mother   . Hypertension Mother   . Anxiety disorder Mother   . Depression Mother   . Heart attack Father   . Hypertension Father   . Bipolar disorder Sister   . Heart attack Brother   . Alcohol abuse Brother   . Drug abuse Brother   . Heart attack Brother   . Alcohol abuse Brother   . Drug abuse Brother    Family Psychiatric  History: Positive for bipolar disorder Social History:  History  Alcohol Use No     History  Drug Use No    Social History   Social History  . Marital Status: Divorced    Spouse Name: N/A  . Number of Children: N/A  . Years of Education: N/A   Social History Main Topics  . Smoking status: Current Every Day Smoker -- 0.50 packs/day    Types: Cigarettes    Start date: 11/09/1994  . Smokeless tobacco: Never Used  . Alcohol Use: No  . Drug Use: No  . Sexual Activity: Yes     Birth Control/ Protection: Condom   Other Topics Concern  . None   Social History Narrative   Additional Social History:                         Sleep: Good  Appetite:  Good  Current Medications: Current Outpatient Prescriptions  Medication Sig Dispense Refill  . amphetamine-dextroamphetamine (ADDERALL XR) 30 MG 24 hr capsule Take 1 capsule (30 mg total) by mouth 2 (two) times daily. 60 capsule 0  . amphetamine-dextroamphetamine (ADDERALL XR) 30 MG 24 hr capsule Take 1 capsule (30 mg total) by mouth 2 (two) times daily. 60 capsule 0  . amphetamine-dextroamphetamine (ADDERALL XR) 30 MG 24 hr capsule Take 1 capsule (30 mg total) by mouth 2 (two) times daily. 60 capsule 0   No current facility-administered medications for this visit.    Lab Results: No results found for this or any previous visit (from the past 48 hour(s)).  Physical Findings: AIMS:  , ,  ,  ,    CIWA:    COWS:     Musculoskeletal: Strength & Muscle Tone: within normal limits Gait & Station: normal Patient leans: N/A  Psychiatric Specialty Exam: ROS  Blood pressure 142/90, pulse 89, temperature 97.6 F (36.4 C), temperature source Tympanic, height 5\' 2"  (  1.575 m), weight 62.778 kg (138 lb 6.4 oz), SpO2 98 %.Body mass index is 25.31 kg/(m^2).  General Appearance: Casual  Eye Contact::  Good  Speech:  Clear and Coherent  Volume:  Normal  Mood:  Euthymic  Affect:  Congruent  Thought Process:  Goal Directed  Orientation:  Full (Time, Place, and Person)  Thought Content:  Negative  Suicidal Thoughts:  No  Homicidal Thoughts:  No  Memory:  Immediate;   Fair Recent;   Fair Remote;   Fair  Judgement:  Fair  Insight:  Fair  Psychomotor Activity:  Normal  Concentration:  Fair  Recall:  [employer]  Fund of Knowledge:Fair  Language: Fair  Akathisia:  No  Handed:  Right  AIMS (if indicated):     Assets:  Desire for Improvement Financial Resources/Insurance Housing Physical Health Resilience   ADL's:  Intact  Cognition: WNL  Sleep:      Treatment Plan Summary: Medication management and Plan continue current medication with the Adderall but discontinue the Celexa. Follow-up in 3 months. Supportive counseling done.  John Clapacs 04/26/2015, 2:21 PM

## 2015-07-19 ENCOUNTER — Encounter: Payer: Self-pay | Admitting: Psychiatry

## 2015-07-19 ENCOUNTER — Emergency Department
Admission: EM | Admit: 2015-07-19 | Discharge: 2015-07-19 | Disposition: A | Discharge status: Home / Self Care | Payer: Medicare Other | Attending: Emergency Medicine | Admitting: Emergency Medicine

## 2015-07-19 ENCOUNTER — Ambulatory Visit (INDEPENDENT_AMBULATORY_CARE_PROVIDER_SITE_OTHER): Payer: Medicare Other | Admitting: Psychiatry

## 2015-07-19 ENCOUNTER — Encounter: Payer: Self-pay | Admitting: Emergency Medicine

## 2015-07-19 VITALS — BP 168/100 | HR 96 | Temp 98.2°F | Ht 62.0 in | Wt 143.8 lb

## 2015-07-19 DIAGNOSIS — F329 Major depressive disorder, single episode, unspecified: Secondary | ICD-10-CM | POA: Diagnosis not present

## 2015-07-19 DIAGNOSIS — I159 Secondary hypertension, unspecified: Secondary | ICD-10-CM

## 2015-07-19 DIAGNOSIS — F902 Attention-deficit hyperactivity disorder, combined type: Secondary | ICD-10-CM | POA: Diagnosis not present

## 2015-07-19 DIAGNOSIS — F17210 Nicotine dependence, cigarettes, uncomplicated: Secondary | ICD-10-CM | POA: Insufficient documentation

## 2015-07-19 DIAGNOSIS — F909 Attention-deficit hyperactivity disorder, unspecified type: Secondary | ICD-10-CM | POA: Insufficient documentation

## 2015-07-19 DIAGNOSIS — I10 Essential (primary) hypertension: Secondary | ICD-10-CM | POA: Diagnosis present

## 2015-07-19 DIAGNOSIS — F411 Generalized anxiety disorder: Secondary | ICD-10-CM | POA: Diagnosis not present

## 2015-07-19 LAB — CBC
HCT: 47.1 % — ABNORMAL HIGH (ref 35.0–47.0)
Hemoglobin: 16.3 g/dL — ABNORMAL HIGH (ref 12.0–16.0)
MCH: 31.2 pg (ref 26.0–34.0)
MCHC: 34.6 g/dL (ref 32.0–36.0)
MCV: 89.9 fL (ref 80.0–100.0)
PLATELETS: 101 10*3/uL — AB (ref 150–440)
RBC: 5.23 MIL/uL — AB (ref 3.80–5.20)
RDW: 13 % (ref 11.5–14.5)
WBC: 6.1 10*3/uL (ref 3.6–11.0)

## 2015-07-19 LAB — BASIC METABOLIC PANEL
Anion gap: 4 — ABNORMAL LOW (ref 5–15)
BUN: 11 mg/dL (ref 6–20)
CALCIUM: 9.5 mg/dL (ref 8.9–10.3)
CO2: 29 mmol/L (ref 22–32)
CREATININE: 0.71 mg/dL (ref 0.44–1.00)
Chloride: 103 mmol/L (ref 101–111)
Glucose, Bld: 106 mg/dL — ABNORMAL HIGH (ref 65–99)
Potassium: 3.5 mmol/L (ref 3.5–5.1)
SODIUM: 136 mmol/L (ref 135–145)

## 2015-07-19 LAB — TROPONIN I

## 2015-07-19 MED ORDER — AMPHETAMINE-DEXTROAMPHET ER 30 MG PO CP24: 30.0000 mg | ORAL_CAPSULE | Freq: Two times a day (BID) | ORAL | Status: DC

## 2015-07-19 NOTE — ED Notes (Signed)
Brought from dr clapacs office for htn.  168/100 and 200/110. No sx

## 2015-07-19 NOTE — ED Provider Notes (Signed)
[facility] Emergency Department Provider Note    ____________________________________________  Time seen: ~1525  I have reviewed the triage vital signs and the nursing notes.   HISTORY  Chief Complaint Hypertension   History limited by: Not Limited   HPI Karina Cervantes is a 50 y.o. female who presents to the emergency department today from Dr. Toni Amendlapacs office because of concerns for elevated blood pressure. The patient does state she has a history of elevated blood pressure and had been put on Klonopin a number of years ago but stopped it after she stopped seeing her doctor. She does have documentation in the chart from other doctor visits where she has also had high blood pressure. Patient denied any headaches, chest pain. She denies any leg swelling.   Past Medical History  Diagnosis Date  . Anxiety   . ADHD (attention deficit hyperactivity disorder)   . Bipolar disorder (HCC)   . Depression     Patient Active Problem List   Diagnosis Date Noted  . 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 ADD (attention deficit disorder) 11/09/2014    Past Surgical History  Procedure Laterality Date  . Appendectomy      Current Outpatient Rx  Name  Route  Sig  Dispense  Refill  . amphetamine-dextroamphetamine (ADDERALL XR) 30 MG 24 hr capsule   Oral   Take 1 capsule (30 mg total) by mouth 2 (two) times daily.   60 capsule   0   . amphetamine-dextroamphetamine (ADDERALL XR) 30 MG 24 hr capsule   Oral   Take 1 capsule (30 mg total) by mouth 2 (two) times daily.   60 capsule   0   . amphetamine-dextroamphetamine (ADDERALL XR) 30 MG 24 hr capsule   Oral   Take 1 capsule (30 mg total) by mouth 2 (two) times daily.   60 capsule   0   . amphetamine-dextroamphetamine (ADDERALL XR) 30 MG 24 hr capsule   Oral   Take 1 capsule (30 mg total) by mouth 2 (two) times daily.   60 capsule   0   . amphetamine-dextroamphetamine (ADDERALL XR) 30 MG 24 hr capsule   Oral   Take 1 capsule (30 mg total) by mouth 2 (two) times daily.   60 capsule   0     Allergies Penicillins and Latex  Family History  Problem Relation Age of Onset  . Stroke Mother   . Hypertension Mother   . Anxiety disorder Mother   . Depression Mother   . Heart attack Father   . Hypertension Father   . Bipolar disorder Sister   . Heart attack Brother   . Alcohol abuse Brother   . Drug abuse Brother   . Heart attack Brother   . Alcohol abuse Brother   . Drug abuse Brother     Social History Social History  Substance Use Topics  . Smoking status: Current Every Day Smoker -- 0.50 packs/day    Types: Cigarettes    Start date: 11/09/1994  . Smokeless tobacco: Never Used  . Alcohol Use: No    Review of Systems  Constitutional: Negative for fever. Cardiovascular: Negative for chest pain. Respiratory: Negative for shortness of breath. Gastrointestinal: Negative for abdominal pain, vomiting and diarrhea. Neurological: Negative for headaches, focal weakness or numbness.   10-point ROS otherwise negative.  ____________________________________________   PHYSICAL EXAM:  VITAL SIGNS: ED Triage Vitals  Enc Vitals Group     BP 07/19/15 1414 215/88 mmHg  Pulse Rate 07/19/15 1414 76     Resp 07/19/15 1414 16     Temp 07/19/15 1414 97.9 F (36.6 C)     Temp src --      SpO2 07/19/15 1414 97 %     Weight 07/19/15 1414 138 lb (62.596 kg)     Height 07/19/15 1414  (1.575 m)   Constitutional: Alert and oriented. Well appearing and in no distress. Eyes: Conjunctivae are normal. PERRL. Normal extraocular movements. ENT   Head: Normocephalic and atraumatic.   Nose: No congestion/rhinnorhea.   Mouth/Throat: Mucous membranes are moist.   Neck: No stridor. Hematological/Lymphatic/Immunilogical: No cervical lymphadenopathy. Cardiovascular: Normal rate, regular rhythm.  No murmurs, rubs, or gallops. Respiratory: Normal respiratory effort without  tachypnea nor retractions. Breath sounds are clear and equal bilaterally. No wheezes/rales/rhonchi. Gastrointestinal: Soft and nontender. No distention.  Genitourinary: Deferred Musculoskeletal: Normal range of motion in all extremities. No joint effusions.  No lower extremity tenderness nor edema. Neurologic:  Normal speech and language. No gross focal neurologic deficits are appreciated.  Skin:  Skin is warm, dry and intact. No rash noted. Psychiatric: Mood and affect are normal. Speech and behavior are normal. Patient exhibits appropriate insight and judgment.  ____________________________________________    LABS (pertinent positives/negatives)  Labs Reviewed  BASIC METABOLIC PANEL - Abnormal; Notable for the following:    Glucose, Bld 106 (*)    Anion gap 4 (*)    All other components within normal limits  CBC - Abnormal; Notable for the following:    RBC 5.23 (*)    Hemoglobin 16.3 (*)    HCT 47.1 (*)    Platelets 101 (*)    All other components within normal limits  TROPONIN I     ____________________________________________   EKG  I, Phineas Semen, attending physician, personally viewed and interpreted this EKG  EKG Time: 1425 Rate: 72 Rhythm: normal sinus rhythm Axis: normal Intervals: qtc 438 QRS: narrow, q waves V1, V2 ST changes: no st elevation Impression: abnormal ekg ____________________________________________    RADIOLOGY  None  ____________________________________________   PROCEDURES  Procedure(s) performed: None  Critical Care performed: No  ____________________________________________   INITIAL IMPRESSION / ASSESSMENT AND PLAN / ED COURSE  Pertinent labs & imaging results that were available during my care of the patient were reviewed by me and considered in my medical decision making (see chart for details).  Patient sent from psychiatry clinic today because of concerns for elevated blood pressure. Her blood pressure in the  emergency department was slightly labile. Patient completely asymptomatic. Blood work without any concerning findings. This point I had a discussion with the patient that she needs follow-up with primary care for further blood pressure management and evaluation.  ____________________________________________   FINAL CLINICAL IMPRESSION(S) / ED DIAGNOSES  Final diagnoses:  Secondary hypertension, unspecified     Phineas Semen, MD 07/19/15 [id number]

## 2015-07-19 NOTE — Progress Notes (Signed)
BH MD/PA/NP OP Progress Note  07/19/2015 7:31 PM Karina RamsaySusan A Cervantes  MRN:  [id number]  Chief Complaint:  Chief Complaint    Follow-up; Medication Refill     Subjective:  Follow-up for 50 year old woman with ADHD and generalized anxiety. She is very stressed out today because she was running late. Overall however she feels like she continues to function well. Denies any new medical problems. She is sleeping well. Patient was noted to have high blood pressure today. She reports that she has been compliant with her medicine but her blood pressure is still running very high. Continues to get good benefit from the Adderall and has not previously had elevated blood pressure as a result of it. HPI: See note above. Today hypertensive. Positive compliance with medicine. Visit Diagnosis:    ICD-9-CM ICD-10-CM   1. ADHD (attention deficit hyperactivity disorder), combined type 314.01 F90.2   2. Generalized anxiety disorder 300.02 F41.1     Past Psychiatric History: History of ADHD. History of anxiety. No history of suicide attempts or violence.  Past Medical History:  Past Medical History  Diagnosis Date  . Anxiety   . ADHD (attention deficit hyperactivity disorder)   . Bipolar disorder (HCC)   . Depression     Past Surgical History  Procedure Laterality Date  . Appendectomy      Family Psychiatric History: Family history positive for ADHD  Family History:  Family History  Problem Relation Age of Onset  . Stroke Mother   . Hypertension Mother   . Anxiety disorder Mother   . Depression Mother   . Heart attack Father   . Hypertension Father   . Bipolar disorder Sister   . Heart attack Brother   . Alcohol abuse Brother   . Drug abuse Brother   . Heart attack Brother   . Alcohol abuse Brother   . Drug abuse Brother     Social History:  Social History   Social History  . Marital Status: Divorced    Spouse Name: N/A  . Number of Children: N/A  . Years of Education: N/A    Social History Main Topics  . Smoking status: Current Every Day Smoker -- 0.50 packs/day    Types: Cigarettes    Start date: 11/09/1994  . Smokeless tobacco: Never Used  . Alcohol Use: No  . Drug Use: No  . Sexual Activity: Yes    Birth Control/ Protection: Condom   Other Topics Concern  . None   Social History Narrative    Allergies:  Allergies  Allergen Reactions  . Penicillins Nausea And Vomiting  . Latex Itching    Metabolic Disorder Labs: No results found for: HGBA1C, MPG No results found for: PROLACTIN No results found for: CHOL, TRIG, HDL, CHOLHDL, VLDL, LDLCALC   Current Medications: Current Outpatient Prescriptions  Medication Sig Dispense Refill  . amphetamine-dextroamphetamine (ADDERALL XR) 30 MG 24 hr capsule Take 1 capsule (30 mg total) by mouth 2 (two) times daily. 60 capsule 0  . amphetamine-dextroamphetamine (ADDERALL XR) 30 MG 24 hr capsule Take 1 capsule (30 mg total) by mouth 2 (two) times daily. 60 capsule 0  . amphetamine-dextroamphetamine (ADDERALL XR) 30 MG 24 hr capsule Take 1 capsule (30 mg total) by mouth 2 (two) times daily. 60 capsule 0  . amphetamine-dextroamphetamine (ADDERALL XR) 30 MG 24 hr capsule Take 1 capsule (30 mg total) by mouth 2 (two) times daily. 60 capsule 0  . amphetamine-dextroamphetamine (ADDERALL XR) 30 MG 24 hr capsule Take 1 capsule (  30 mg total) by mouth 2 (two) times daily. 60 capsule 0   No current facility-administered medications for this visit.    Neurologic: Headache: No Seizure: No Paresthesias: No  Musculoskeletal: Strength & Muscle Tone: within normal limits Gait & Station: normal Patient leans: N/A  Psychiatric Specialty Exam: ROS  Blood pressure 168/100, pulse 96, temperature 98.2 F (36.8 C), temperature source Tympanic, height  (1.575 m), weight 143 lb 12.8 oz (65.227 kg), SpO2 95 %.Body mass index is 26.29 kg/(m^2).  General Appearance: Casual  Eye Contact:  Good  Speech:  Clear and  Coherent  Volume:  Normal  Mood:  Anxious  Affect:  Congruent  Thought Process:  Goal Directed  Orientation:  Full (Time, Place, and Person)  Thought Content:  Negative  Suicidal Thoughts:  No  Homicidal Thoughts:  No  Memory:  Immediate;   Good Recent;   Good Remote;   Good  Judgement:  Good  Insight:  Fair  Psychomotor Activity:  Normal  Concentration:  Fair  Recall:  [employer] of Knowledge: Fair  Language: Fair  Akathisia:  No  Handed:  Right  AIMS (if indicated):  None  Assets:  Communication Skills Desire for Improvement Financial Resources/Insurance Housing Resilience Social Support  ADL's:  Intact  Cognition: WNL  Sleep:  Good     Treatment Plan Summary:Medication management and Plan continue Adderall 30 mg twice a day. Patient was counseled that because of her very elevated blood pressure today she should go to the emergency room to have her labs checked and be reevaluated. Otherwise follow-up in 3 months no other change to treatment plan supportive therapy and review of medicine completed   Mordecai Rasmussen, MD 07/19/2015, 7:31 PM

## 2015-07-19 NOTE — ED Notes (Signed)
Pt arrives from [facility]/psychiatrist office - nurse in his office reported to pt that her blood pressure is really high and that she will probably have a stroke   Pt presents to me with increased anxiety and BP 166/83    Pt reports that two years ago she was taking clonidine but she never went back to the doctor for BP concerns  Pt asymptomatic during assessment

## 2015-07-19 NOTE — Discharge Instructions (Signed)
As discussed please follow up with a primary care doctor for further hypertension evaluation. Please seek medical attention for any high fevers, chest pain, shortness of breath, change in behavior, persistent vomiting, bloody stool or any other new or concerning symptoms.   Hypertension Hypertension is another name for high blood pressure. High blood pressure forces your heart to work harder to pump blood. A blood pressure reading has two numbers, which includes a higher number over a lower number (example: 110/72). HOME CARE   Have your blood pressure rechecked by your doctor.  Only take medicine as told by your doctor. Follow the directions carefully. The medicine does not work as well if you skip doses. Skipping doses also puts you at risk for problems.  Do not smoke.  Monitor your blood pressure at home as told by your doctor. GET HELP IF:  You think you are having a reaction to the medicine you are taking.  You have repeat headaches or feel dizzy.  You have puffiness (swelling) in your ankles.  You have trouble with your vision. GET HELP RIGHT AWAY IF:   You get a very bad headache and are confused.  You feel weak, numb, or faint.  You get chest or belly (abdominal) pain.  You throw up (vomit).  You cannot breathe very well. MAKE SURE YOU:   Understand these instructions.  Will watch your condition.  Will get help right away if you are not doing well or get worse.   This information is not intended to replace advice given to you by your health care provider. Make sure you discuss any questions you have with your health care provider.   Document Released: 09/13/2007 Document Revised: 04/01/2013 Document Reviewed: 01/17/2013 Elsevier Interactive Patient Education [employer]2016 Elsevier Inc.

## 2015-10-11 DIAGNOSIS — Z760 Encounter for issue of repeat prescription: Secondary | ICD-10-CM | POA: Diagnosis not present

## 2015-10-19 ENCOUNTER — Ambulatory Visit (INDEPENDENT_AMBULATORY_CARE_PROVIDER_SITE_OTHER): Payer: Medicare Other | Admitting: Psychiatry

## 2015-10-19 DIAGNOSIS — F902 Attention-deficit hyperactivity disorder, combined type: Secondary | ICD-10-CM | POA: Diagnosis not present

## 2015-10-19 MED ORDER — AMPHETAMINE-DEXTROAMPHET ER 30 MG PO CP24: 30.0000 mg | ORAL_CAPSULE | Freq: Two times a day (BID) | ORAL | Status: DC

## 2015-10-19 NOTE — Progress Notes (Signed)
Patient ID: Karina RamsaySusan A Cervantes, female   DOB: [birth year], 50 y.o.   MRN: [id number] Follow-up for patient with ADHD. No new complaints. Mood is stable. Attention and focus are stable. Sleeping adequately. No problems with appetite. On full review of systems she has no specific complaints. Specifically no complaints of mood symptoms psychotic symptoms or attention or focus symptoms.  Patient is neatly groomed, alert and oriented 4. Short and long-term memory intact. Affect euthymic. Mood calm. No psychotic symptoms or delusions or agitation.  Blood pressure reported to be under better control.  [facility] and got a prescription to refill her medicine on July 3 because she ran out. This is a little bit early. Reviewed appropriate use of medicine. I have written her new prescriptions that should not be filled until that one runs out. Hopefully we will not get out of sync.  No change to diagnosis. Follow-up 3 months.

## 2016-01-04 ENCOUNTER — Ambulatory Visit (INDEPENDENT_AMBULATORY_CARE_PROVIDER_SITE_OTHER): Payer: Medicare Other | Admitting: Psychiatry

## 2016-01-04 ENCOUNTER — Encounter: Payer: Self-pay | Admitting: Psychiatry

## 2016-01-04 VITALS — BP 228/95 | HR 91 | Temp 98.2°F | Wt 151.4 lb

## 2016-01-04 DIAGNOSIS — F902 Attention-deficit hyperactivity disorder, combined type: Secondary | ICD-10-CM | POA: Diagnosis not present

## 2016-01-04 DIAGNOSIS — F411 Generalized anxiety disorder: Secondary | ICD-10-CM

## 2016-01-04 MED ORDER — AMPHETAMINE-DEXTROAMPHET ER 30 MG PO CP24: 30.0000 mg | ORAL_CAPSULE | Freq: Two times a day (BID) | ORAL | 0 refills | Status: DC

## 2016-01-04 NOTE — Progress Notes (Signed)
No new complaints. Mood is good. Attention and focus are good. No suicidal ideation no psychosis. Her blood pressure is elevated.  Casually dressed neatly groomed woman. Calm. Lucid. No sign of acute dangerousness or psychosis.  Reviewed treatment plan. Reviewed medication usage. Instructed her to please immediately get into see a primary care doctor to follow-up with her blood pressure.  I am trying to make sure she is not refilling her prescriptions early. Dates are clear on the prescriptions. Follow-up 3 months.

## 2016-03-17 DIAGNOSIS — F17210 Nicotine dependence, cigarettes, uncomplicated: Secondary | ICD-10-CM | POA: Diagnosis not present

## 2016-03-17 DIAGNOSIS — J45909 Unspecified asthma, uncomplicated: Secondary | ICD-10-CM | POA: Diagnosis not present

## 2016-03-17 DIAGNOSIS — I10 Essential (primary) hypertension: Secondary | ICD-10-CM | POA: Diagnosis not present

## 2016-03-17 DIAGNOSIS — R05 Cough: Secondary | ICD-10-CM | POA: Diagnosis not present

## 2016-03-17 DIAGNOSIS — Z880 Allergy status to penicillin: Secondary | ICD-10-CM | POA: Diagnosis not present

## 2016-03-17 DIAGNOSIS — R0602 Shortness of breath: Secondary | ICD-10-CM | POA: Diagnosis not present

## 2016-03-17 DIAGNOSIS — Z91040 Latex allergy status: Secondary | ICD-10-CM | POA: Diagnosis not present

## 2016-03-17 DIAGNOSIS — R0981 Nasal congestion: Secondary | ICD-10-CM | POA: Diagnosis not present

## 2016-03-17 DIAGNOSIS — J069 Acute upper respiratory infection, unspecified: Secondary | ICD-10-CM | POA: Diagnosis not present

## 2016-03-17 DIAGNOSIS — Z7952 Long term (current) use of systemic steroids: Secondary | ICD-10-CM | POA: Diagnosis not present

## 2016-03-27 ENCOUNTER — Encounter: Payer: Self-pay | Admitting: Psychiatry

## 2016-03-27 ENCOUNTER — Ambulatory Visit (INDEPENDENT_AMBULATORY_CARE_PROVIDER_SITE_OTHER): Payer: Medicare Other | Admitting: Psychiatry

## 2016-03-27 VITALS — BP 190/105 | HR 76 | Temp 98.4°F | Wt 156.6 lb

## 2016-03-27 DIAGNOSIS — F411 Generalized anxiety disorder: Secondary | ICD-10-CM

## 2016-03-27 DIAGNOSIS — F902 Attention-deficit hyperactivity disorder, combined type: Secondary | ICD-10-CM | POA: Diagnosis not present

## 2016-03-27 MED ORDER — AMPHETAMINE-DEXTROAMPHET ER 30 MG PO CP24: 30.0000 mg | ORAL_CAPSULE | Freq: Two times a day (BID) | ORAL | 0 refills | Status: DC

## 2016-03-27 NOTE — Progress Notes (Signed)
Follow-up patient with ADHD. No new complaints. Continues to have more benefit with focusing and paying attention although she doesn't feel overstimulated. Mood has been stable. Denies suicidal ideation. Denies psychosis. Blood pressure is up but that appears to be more chronic. She is taking some medicine for it.  Neatly dressed. Alert and oriented. Good eye contact. Affect euthymic. Thoughts lucid.  Renew Adderall. Reviewed treatment plan. Follow-up 3 months.

## 2016-06-16 DIAGNOSIS — J069 Acute upper respiratory infection, unspecified: Secondary | ICD-10-CM | POA: Diagnosis not present

## 2016-06-16 DIAGNOSIS — I10 Essential (primary) hypertension: Secondary | ICD-10-CM | POA: Diagnosis not present

## 2016-06-20 ENCOUNTER — Ambulatory Visit (INDEPENDENT_AMBULATORY_CARE_PROVIDER_SITE_OTHER): Payer: Medicare Other | Admitting: Psychiatry

## 2016-06-20 ENCOUNTER — Encounter: Payer: Self-pay | Admitting: Psychiatry

## 2016-06-20 VITALS — BP 208/95 | HR 83 | Temp 98.5°F | Wt 157.0 lb

## 2016-06-20 DIAGNOSIS — F902 Attention-deficit hyperactivity disorder, combined type: Secondary | ICD-10-CM

## 2016-06-20 DIAGNOSIS — F411 Generalized anxiety disorder: Secondary | ICD-10-CM

## 2016-06-20 MED ORDER — AMPHETAMINE-DEXTROAMPHET ER 30 MG PO CP24: 30.0000 mg | ORAL_CAPSULE | Freq: Two times a day (BID) | ORAL | 0 refills | Status: DC

## 2016-06-22 ENCOUNTER — Telehealth: Payer: Self-pay

## 2016-06-22 NOTE — Telephone Encounter (Signed)
called pt to make sure that she had follow up with pcp or cardiology about her high bp pt states she has an appt on monday to see her pcp.

## 2016-06-28 DIAGNOSIS — F900 Attention-deficit hyperactivity disorder, predominantly inattentive type: Secondary | ICD-10-CM | POA: Diagnosis not present

## 2016-06-28 DIAGNOSIS — F439 Reaction to severe stress, unspecified: Secondary | ICD-10-CM | POA: Diagnosis not present

## 2016-07-17 DIAGNOSIS — G8194 Hemiplegia, unspecified affecting left nondominant side: Secondary | ICD-10-CM | POA: Diagnosis not present

## 2016-07-17 DIAGNOSIS — M6281 Muscle weakness (generalized): Secondary | ICD-10-CM | POA: Diagnosis not present

## 2016-07-17 DIAGNOSIS — I348 Other nonrheumatic mitral valve disorders: Secondary | ICD-10-CM | POA: Diagnosis not present

## 2016-07-17 DIAGNOSIS — I639 Cerebral infarction, unspecified: Secondary | ICD-10-CM | POA: Diagnosis not present

## 2016-07-17 DIAGNOSIS — J101 Influenza due to other identified influenza virus with other respiratory manifestations: Secondary | ICD-10-CM | POA: Diagnosis not present

## 2016-07-17 DIAGNOSIS — F909 Attention-deficit hyperactivity disorder, unspecified type: Secondary | ICD-10-CM | POA: Diagnosis not present

## 2016-07-17 DIAGNOSIS — R4781 Slurred speech: Secondary | ICD-10-CM | POA: Diagnosis not present

## 2016-07-17 DIAGNOSIS — R9431 Abnormal electrocardiogram [ECG] [EKG]: Secondary | ICD-10-CM | POA: Diagnosis not present

## 2016-07-17 DIAGNOSIS — I10 Essential (primary) hypertension: Secondary | ICD-10-CM | POA: Diagnosis not present

## 2016-07-17 DIAGNOSIS — I517 Cardiomegaly: Secondary | ICD-10-CM | POA: Diagnosis not present

## 2016-07-18 DIAGNOSIS — F909 Attention-deficit hyperactivity disorder, unspecified type: Secondary | ICD-10-CM | POA: Diagnosis not present

## 2016-07-18 DIAGNOSIS — I10 Essential (primary) hypertension: Secondary | ICD-10-CM | POA: Diagnosis not present

## 2016-07-18 DIAGNOSIS — J101 Influenza due to other identified influenza virus with other respiratory manifestations: Secondary | ICD-10-CM | POA: Diagnosis not present

## 2016-07-18 DIAGNOSIS — I639 Cerebral infarction, unspecified: Secondary | ICD-10-CM | POA: Diagnosis not present

## 2016-07-19 DIAGNOSIS — I639 Cerebral infarction, unspecified: Secondary | ICD-10-CM | POA: Insufficient documentation

## 2016-07-19 DIAGNOSIS — I10 Essential (primary) hypertension: Secondary | ICD-10-CM | POA: Diagnosis not present

## 2016-07-19 DIAGNOSIS — J1089 Influenza due to other identified influenza virus with other manifestations: Secondary | ICD-10-CM | POA: Diagnosis not present

## 2016-07-20 DIAGNOSIS — G4700 Insomnia, unspecified: Secondary | ICD-10-CM | POA: Diagnosis not present

## 2016-07-20 DIAGNOSIS — J101 Influenza due to other identified influenza virus with other respiratory manifestations: Secondary | ICD-10-CM | POA: Diagnosis not present

## 2016-07-20 DIAGNOSIS — F17200 Nicotine dependence, unspecified, uncomplicated: Secondary | ICD-10-CM | POA: Insufficient documentation

## 2016-07-20 DIAGNOSIS — I639 Cerebral infarction, unspecified: Secondary | ICD-10-CM | POA: Diagnosis not present

## 2016-07-20 DIAGNOSIS — F17210 Nicotine dependence, cigarettes, uncomplicated: Secondary | ICD-10-CM | POA: Diagnosis not present

## 2016-07-20 DIAGNOSIS — I10 Essential (primary) hypertension: Secondary | ICD-10-CM | POA: Diagnosis not present

## 2016-07-21 DIAGNOSIS — G4700 Insomnia, unspecified: Secondary | ICD-10-CM | POA: Diagnosis not present

## 2016-07-21 DIAGNOSIS — I639 Cerebral infarction, unspecified: Secondary | ICD-10-CM | POA: Diagnosis not present

## 2016-07-21 DIAGNOSIS — I10 Essential (primary) hypertension: Secondary | ICD-10-CM | POA: Diagnosis not present

## 2016-07-22 DIAGNOSIS — R110 Nausea: Secondary | ICD-10-CM | POA: Diagnosis not present

## 2016-07-22 DIAGNOSIS — I639 Cerebral infarction, unspecified: Secondary | ICD-10-CM | POA: Diagnosis not present

## 2016-07-22 DIAGNOSIS — G4700 Insomnia, unspecified: Secondary | ICD-10-CM | POA: Diagnosis not present

## 2016-07-22 DIAGNOSIS — I10 Essential (primary) hypertension: Secondary | ICD-10-CM | POA: Diagnosis not present

## 2016-07-23 DIAGNOSIS — R110 Nausea: Secondary | ICD-10-CM | POA: Diagnosis not present

## 2016-07-23 DIAGNOSIS — G4700 Insomnia, unspecified: Secondary | ICD-10-CM | POA: Diagnosis not present

## 2016-07-23 DIAGNOSIS — I10 Essential (primary) hypertension: Secondary | ICD-10-CM | POA: Diagnosis not present

## 2016-07-23 DIAGNOSIS — I639 Cerebral infarction, unspecified: Secondary | ICD-10-CM | POA: Diagnosis not present

## 2016-07-24 DIAGNOSIS — F410 Panic disorder [episodic paroxysmal anxiety] without agoraphobia: Secondary | ICD-10-CM | POA: Diagnosis not present

## 2016-07-24 DIAGNOSIS — I69154 Hemiplegia and hemiparesis following nontraumatic intracerebral hemorrhage affecting left non-dominant side: Secondary | ICD-10-CM | POA: Diagnosis not present

## 2016-07-24 DIAGNOSIS — R279 Unspecified lack of coordination: Secondary | ICD-10-CM | POA: Diagnosis not present

## 2016-07-24 DIAGNOSIS — F339 Major depressive disorder, recurrent, unspecified: Secondary | ICD-10-CM | POA: Diagnosis not present

## 2016-07-24 DIAGNOSIS — I6350 Cerebral infarction due to unspecified occlusion or stenosis of unspecified cerebral artery: Secondary | ICD-10-CM | POA: Diagnosis not present

## 2016-07-24 DIAGNOSIS — F329 Major depressive disorder, single episode, unspecified: Secondary | ICD-10-CM | POA: Diagnosis not present

## 2016-07-24 DIAGNOSIS — R41841 Cognitive communication deficit: Secondary | ICD-10-CM | POA: Diagnosis not present

## 2016-07-24 DIAGNOSIS — Z5189 Encounter for other specified aftercare: Secondary | ICD-10-CM | POA: Diagnosis not present

## 2016-07-24 DIAGNOSIS — I63511 Cerebral infarction due to unspecified occlusion or stenosis of right middle cerebral artery: Secondary | ICD-10-CM | POA: Diagnosis not present

## 2016-07-24 DIAGNOSIS — I10 Essential (primary) hypertension: Secondary | ICD-10-CM | POA: Diagnosis not present

## 2016-07-24 DIAGNOSIS — R262 Difficulty in walking, not elsewhere classified: Secondary | ICD-10-CM | POA: Diagnosis not present

## 2016-07-24 DIAGNOSIS — E785 Hyperlipidemia, unspecified: Secondary | ICD-10-CM | POA: Diagnosis not present

## 2016-07-24 DIAGNOSIS — G4700 Insomnia, unspecified: Secondary | ICD-10-CM | POA: Diagnosis not present

## 2016-07-24 DIAGNOSIS — R05 Cough: Secondary | ICD-10-CM | POA: Diagnosis not present

## 2016-07-24 DIAGNOSIS — E1165 Type 2 diabetes mellitus with hyperglycemia: Secondary | ICD-10-CM | POA: Diagnosis not present

## 2016-07-24 DIAGNOSIS — M6281 Muscle weakness (generalized): Secondary | ICD-10-CM | POA: Diagnosis not present

## 2016-07-24 DIAGNOSIS — F909 Attention-deficit hyperactivity disorder, unspecified type: Secondary | ICD-10-CM | POA: Diagnosis not present

## 2016-07-24 DIAGNOSIS — R5381 Other malaise: Secondary | ICD-10-CM | POA: Diagnosis not present

## 2016-07-24 DIAGNOSIS — R1312 Dysphagia, oropharyngeal phase: Secondary | ICD-10-CM | POA: Diagnosis not present

## 2016-07-24 DIAGNOSIS — E119 Type 2 diabetes mellitus without complications: Secondary | ICD-10-CM | POA: Diagnosis not present

## 2016-07-27 ENCOUNTER — Other Ambulatory Visit: Payer: Self-pay | Admitting: *Deleted

## 2016-07-27 DIAGNOSIS — R05 Cough: Secondary | ICD-10-CM | POA: Diagnosis not present

## 2016-07-27 DIAGNOSIS — E119 Type 2 diabetes mellitus without complications: Secondary | ICD-10-CM | POA: Diagnosis not present

## 2016-07-27 DIAGNOSIS — F329 Major depressive disorder, single episode, unspecified: Secondary | ICD-10-CM | POA: Diagnosis not present

## 2016-07-27 DIAGNOSIS — F410 Panic disorder [episodic paroxysmal anxiety] without agoraphobia: Secondary | ICD-10-CM | POA: Diagnosis not present

## 2016-07-27 DIAGNOSIS — I10 Essential (primary) hypertension: Secondary | ICD-10-CM | POA: Diagnosis not present

## 2016-07-27 DIAGNOSIS — F909 Attention-deficit hyperactivity disorder, unspecified type: Secondary | ICD-10-CM | POA: Diagnosis not present

## 2016-07-27 DIAGNOSIS — I69154 Hemiplegia and hemiparesis following nontraumatic intracerebral hemorrhage affecting left non-dominant side: Secondary | ICD-10-CM | POA: Diagnosis not present

## 2016-07-27 NOTE — Patient Outreach (Signed)
[facility]) Care Management  07/27/2016  Karina Cervantes [birth year] [id number]   Met with [name], SW at facility she reports patient doing well, patient lives with boyfriend in independent apartment. No anticipated [facility] care management needs.   Met with patient at bedside. Patient states she does not have a primary care MD at this time. She is not sure when she will go home but thinks the therapy is going well and she is improving.   Left brochure for future reference. No [facility] community care management needs assessed at this time.  RNCM will sign off. [name]. Laymond Purser, RN, BSN, [place] [phone]) Business Cell  [phone]) Toll Free Office

## 2016-08-02 DIAGNOSIS — F329 Major depressive disorder, single episode, unspecified: Secondary | ICD-10-CM | POA: Diagnosis not present

## 2016-08-02 DIAGNOSIS — I69154 Hemiplegia and hemiparesis following nontraumatic intracerebral hemorrhage affecting left non-dominant side: Secondary | ICD-10-CM | POA: Diagnosis not present

## 2016-08-02 DIAGNOSIS — F410 Panic disorder [episodic paroxysmal anxiety] without agoraphobia: Secondary | ICD-10-CM | POA: Diagnosis not present

## 2016-08-02 DIAGNOSIS — F909 Attention-deficit hyperactivity disorder, unspecified type: Secondary | ICD-10-CM | POA: Diagnosis not present

## 2016-08-02 DIAGNOSIS — E119 Type 2 diabetes mellitus without complications: Secondary | ICD-10-CM | POA: Diagnosis not present

## 2016-08-02 DIAGNOSIS — I10 Essential (primary) hypertension: Secondary | ICD-10-CM | POA: Diagnosis not present

## 2016-08-02 DIAGNOSIS — R05 Cough: Secondary | ICD-10-CM | POA: Diagnosis not present

## 2016-08-13 DIAGNOSIS — G4700 Insomnia, unspecified: Secondary | ICD-10-CM | POA: Diagnosis not present

## 2016-08-13 DIAGNOSIS — F339 Major depressive disorder, recurrent, unspecified: Secondary | ICD-10-CM | POA: Diagnosis not present

## 2016-08-13 DIAGNOSIS — Z9181 History of falling: Secondary | ICD-10-CM | POA: Diagnosis not present

## 2016-08-13 DIAGNOSIS — E119 Type 2 diabetes mellitus without complications: Secondary | ICD-10-CM | POA: Diagnosis not present

## 2016-08-13 DIAGNOSIS — F909 Attention-deficit hyperactivity disorder, unspecified type: Secondary | ICD-10-CM | POA: Diagnosis not present

## 2016-08-13 DIAGNOSIS — Z7984 Long term (current) use of oral hypoglycemic drugs: Secondary | ICD-10-CM | POA: Diagnosis not present

## 2016-08-13 DIAGNOSIS — F410 Panic disorder [episodic paroxysmal anxiety] without agoraphobia: Secondary | ICD-10-CM | POA: Diagnosis not present

## 2016-08-13 DIAGNOSIS — Z7982 Long term (current) use of aspirin: Secondary | ICD-10-CM | POA: Diagnosis not present

## 2016-08-13 DIAGNOSIS — I69354 Hemiplegia and hemiparesis following cerebral infarction affecting left non-dominant side: Secondary | ICD-10-CM | POA: Diagnosis not present

## 2016-08-13 DIAGNOSIS — I10 Essential (primary) hypertension: Secondary | ICD-10-CM | POA: Diagnosis not present

## 2016-08-13 DIAGNOSIS — E785 Hyperlipidemia, unspecified: Secondary | ICD-10-CM | POA: Diagnosis not present

## 2016-08-15 DIAGNOSIS — I69354 Hemiplegia and hemiparesis following cerebral infarction affecting left non-dominant side: Secondary | ICD-10-CM | POA: Diagnosis not present

## 2016-08-15 DIAGNOSIS — I10 Essential (primary) hypertension: Secondary | ICD-10-CM | POA: Diagnosis not present

## 2016-08-15 DIAGNOSIS — E119 Type 2 diabetes mellitus without complications: Secondary | ICD-10-CM | POA: Diagnosis not present

## 2016-08-15 DIAGNOSIS — F909 Attention-deficit hyperactivity disorder, unspecified type: Secondary | ICD-10-CM | POA: Diagnosis not present

## 2016-08-15 DIAGNOSIS — F410 Panic disorder [episodic paroxysmal anxiety] without agoraphobia: Secondary | ICD-10-CM | POA: Diagnosis not present

## 2016-08-15 DIAGNOSIS — F339 Major depressive disorder, recurrent, unspecified: Secondary | ICD-10-CM | POA: Diagnosis not present

## 2016-08-17 DIAGNOSIS — I10 Essential (primary) hypertension: Secondary | ICD-10-CM | POA: Diagnosis not present

## 2016-08-17 DIAGNOSIS — I69354 Hemiplegia and hemiparesis following cerebral infarction affecting left non-dominant side: Secondary | ICD-10-CM | POA: Diagnosis not present

## 2016-08-17 DIAGNOSIS — F339 Major depressive disorder, recurrent, unspecified: Secondary | ICD-10-CM | POA: Diagnosis not present

## 2016-08-17 DIAGNOSIS — F909 Attention-deficit hyperactivity disorder, unspecified type: Secondary | ICD-10-CM | POA: Diagnosis not present

## 2016-08-17 DIAGNOSIS — F410 Panic disorder [episodic paroxysmal anxiety] without agoraphobia: Secondary | ICD-10-CM | POA: Diagnosis not present

## 2016-08-17 DIAGNOSIS — E119 Type 2 diabetes mellitus without complications: Secondary | ICD-10-CM | POA: Diagnosis not present

## 2016-08-18 DIAGNOSIS — I10 Essential (primary) hypertension: Secondary | ICD-10-CM | POA: Diagnosis not present

## 2016-08-18 DIAGNOSIS — F909 Attention-deficit hyperactivity disorder, unspecified type: Secondary | ICD-10-CM | POA: Diagnosis not present

## 2016-08-18 DIAGNOSIS — F410 Panic disorder [episodic paroxysmal anxiety] without agoraphobia: Secondary | ICD-10-CM | POA: Diagnosis not present

## 2016-08-18 DIAGNOSIS — E119 Type 2 diabetes mellitus without complications: Secondary | ICD-10-CM | POA: Diagnosis not present

## 2016-08-18 DIAGNOSIS — F339 Major depressive disorder, recurrent, unspecified: Secondary | ICD-10-CM | POA: Diagnosis not present

## 2016-08-18 DIAGNOSIS — I69354 Hemiplegia and hemiparesis following cerebral infarction affecting left non-dominant side: Secondary | ICD-10-CM | POA: Diagnosis not present

## 2016-08-22 DIAGNOSIS — E119 Type 2 diabetes mellitus without complications: Secondary | ICD-10-CM | POA: Diagnosis not present

## 2016-08-22 DIAGNOSIS — F339 Major depressive disorder, recurrent, unspecified: Secondary | ICD-10-CM | POA: Diagnosis not present

## 2016-08-22 DIAGNOSIS — I10 Essential (primary) hypertension: Secondary | ICD-10-CM | POA: Diagnosis not present

## 2016-08-22 DIAGNOSIS — F909 Attention-deficit hyperactivity disorder, unspecified type: Secondary | ICD-10-CM | POA: Diagnosis not present

## 2016-08-22 DIAGNOSIS — F410 Panic disorder [episodic paroxysmal anxiety] without agoraphobia: Secondary | ICD-10-CM | POA: Diagnosis not present

## 2016-08-22 DIAGNOSIS — I69354 Hemiplegia and hemiparesis following cerebral infarction affecting left non-dominant side: Secondary | ICD-10-CM | POA: Diagnosis not present

## 2016-08-24 DIAGNOSIS — F410 Panic disorder [episodic paroxysmal anxiety] without agoraphobia: Secondary | ICD-10-CM | POA: Diagnosis not present

## 2016-08-24 DIAGNOSIS — F909 Attention-deficit hyperactivity disorder, unspecified type: Secondary | ICD-10-CM | POA: Diagnosis not present

## 2016-08-24 DIAGNOSIS — F339 Major depressive disorder, recurrent, unspecified: Secondary | ICD-10-CM | POA: Diagnosis not present

## 2016-08-24 DIAGNOSIS — E119 Type 2 diabetes mellitus without complications: Secondary | ICD-10-CM | POA: Diagnosis not present

## 2016-08-24 DIAGNOSIS — I10 Essential (primary) hypertension: Secondary | ICD-10-CM | POA: Diagnosis not present

## 2016-08-24 DIAGNOSIS — I69354 Hemiplegia and hemiparesis following cerebral infarction affecting left non-dominant side: Secondary | ICD-10-CM | POA: Diagnosis not present

## 2016-08-25 DIAGNOSIS — E785 Hyperlipidemia, unspecified: Secondary | ICD-10-CM | POA: Diagnosis not present

## 2016-08-25 DIAGNOSIS — I10 Essential (primary) hypertension: Secondary | ICD-10-CM | POA: Diagnosis not present

## 2016-08-25 DIAGNOSIS — F419 Anxiety disorder, unspecified: Secondary | ICD-10-CM | POA: Diagnosis not present

## 2016-08-25 DIAGNOSIS — F988 Other specified behavioral and emotional disorders with onset usually occurring in childhood and adolescence: Secondary | ICD-10-CM | POA: Diagnosis not present

## 2016-08-25 DIAGNOSIS — D696 Thrombocytopenia, unspecified: Secondary | ICD-10-CM | POA: Diagnosis not present

## 2016-08-25 DIAGNOSIS — I639 Cerebral infarction, unspecified: Secondary | ICD-10-CM | POA: Diagnosis not present

## 2016-08-25 DIAGNOSIS — Z23 Encounter for immunization: Secondary | ICD-10-CM | POA: Diagnosis not present

## 2016-08-25 DIAGNOSIS — E139 Other specified diabetes mellitus without complications: Secondary | ICD-10-CM | POA: Diagnosis not present

## 2016-08-29 DIAGNOSIS — F410 Panic disorder [episodic paroxysmal anxiety] without agoraphobia: Secondary | ICD-10-CM | POA: Diagnosis not present

## 2016-08-29 DIAGNOSIS — I69354 Hemiplegia and hemiparesis following cerebral infarction affecting left non-dominant side: Secondary | ICD-10-CM | POA: Diagnosis not present

## 2016-08-29 DIAGNOSIS — F339 Major depressive disorder, recurrent, unspecified: Secondary | ICD-10-CM | POA: Diagnosis not present

## 2016-08-29 DIAGNOSIS — I10 Essential (primary) hypertension: Secondary | ICD-10-CM | POA: Diagnosis not present

## 2016-08-29 DIAGNOSIS — F909 Attention-deficit hyperactivity disorder, unspecified type: Secondary | ICD-10-CM | POA: Diagnosis not present

## 2016-08-29 DIAGNOSIS — E119 Type 2 diabetes mellitus without complications: Secondary | ICD-10-CM | POA: Diagnosis not present

## 2016-08-31 DIAGNOSIS — E119 Type 2 diabetes mellitus without complications: Secondary | ICD-10-CM | POA: Diagnosis not present

## 2016-08-31 DIAGNOSIS — F339 Major depressive disorder, recurrent, unspecified: Secondary | ICD-10-CM | POA: Diagnosis not present

## 2016-08-31 DIAGNOSIS — F909 Attention-deficit hyperactivity disorder, unspecified type: Secondary | ICD-10-CM | POA: Diagnosis not present

## 2016-08-31 DIAGNOSIS — I10 Essential (primary) hypertension: Secondary | ICD-10-CM | POA: Diagnosis not present

## 2016-08-31 DIAGNOSIS — I69354 Hemiplegia and hemiparesis following cerebral infarction affecting left non-dominant side: Secondary | ICD-10-CM | POA: Diagnosis not present

## 2016-08-31 DIAGNOSIS — F410 Panic disorder [episodic paroxysmal anxiety] without agoraphobia: Secondary | ICD-10-CM | POA: Diagnosis not present

## 2016-09-01 DIAGNOSIS — E119 Type 2 diabetes mellitus without complications: Secondary | ICD-10-CM | POA: Diagnosis not present

## 2016-09-01 DIAGNOSIS — I69354 Hemiplegia and hemiparesis following cerebral infarction affecting left non-dominant side: Secondary | ICD-10-CM | POA: Diagnosis not present

## 2016-09-01 DIAGNOSIS — F410 Panic disorder [episodic paroxysmal anxiety] without agoraphobia: Secondary | ICD-10-CM | POA: Diagnosis not present

## 2016-09-01 DIAGNOSIS — F339 Major depressive disorder, recurrent, unspecified: Secondary | ICD-10-CM | POA: Diagnosis not present

## 2016-09-01 DIAGNOSIS — I10 Essential (primary) hypertension: Secondary | ICD-10-CM | POA: Diagnosis not present

## 2016-09-01 DIAGNOSIS — F909 Attention-deficit hyperactivity disorder, unspecified type: Secondary | ICD-10-CM | POA: Diagnosis not present

## 2016-09-04 DIAGNOSIS — F410 Panic disorder [episodic paroxysmal anxiety] without agoraphobia: Secondary | ICD-10-CM | POA: Diagnosis not present

## 2016-09-04 DIAGNOSIS — I10 Essential (primary) hypertension: Secondary | ICD-10-CM | POA: Diagnosis not present

## 2016-09-04 DIAGNOSIS — I69354 Hemiplegia and hemiparesis following cerebral infarction affecting left non-dominant side: Secondary | ICD-10-CM | POA: Diagnosis not present

## 2016-09-04 DIAGNOSIS — E119 Type 2 diabetes mellitus without complications: Secondary | ICD-10-CM | POA: Diagnosis not present

## 2016-09-04 DIAGNOSIS — F339 Major depressive disorder, recurrent, unspecified: Secondary | ICD-10-CM | POA: Diagnosis not present

## 2016-09-04 DIAGNOSIS — F909 Attention-deficit hyperactivity disorder, unspecified type: Secondary | ICD-10-CM | POA: Diagnosis not present

## 2016-09-05 DIAGNOSIS — F909 Attention-deficit hyperactivity disorder, unspecified type: Secondary | ICD-10-CM | POA: Diagnosis not present

## 2016-09-05 DIAGNOSIS — I10 Essential (primary) hypertension: Secondary | ICD-10-CM | POA: Diagnosis not present

## 2016-09-05 DIAGNOSIS — F339 Major depressive disorder, recurrent, unspecified: Secondary | ICD-10-CM | POA: Diagnosis not present

## 2016-09-05 DIAGNOSIS — F410 Panic disorder [episodic paroxysmal anxiety] without agoraphobia: Secondary | ICD-10-CM | POA: Diagnosis not present

## 2016-09-05 DIAGNOSIS — I69354 Hemiplegia and hemiparesis following cerebral infarction affecting left non-dominant side: Secondary | ICD-10-CM | POA: Diagnosis not present

## 2016-09-05 DIAGNOSIS — E119 Type 2 diabetes mellitus without complications: Secondary | ICD-10-CM | POA: Diagnosis not present

## 2016-09-06 DIAGNOSIS — F339 Major depressive disorder, recurrent, unspecified: Secondary | ICD-10-CM | POA: Diagnosis not present

## 2016-09-06 DIAGNOSIS — I69354 Hemiplegia and hemiparesis following cerebral infarction affecting left non-dominant side: Secondary | ICD-10-CM | POA: Diagnosis not present

## 2016-09-06 DIAGNOSIS — E119 Type 2 diabetes mellitus without complications: Secondary | ICD-10-CM | POA: Diagnosis not present

## 2016-09-06 DIAGNOSIS — I10 Essential (primary) hypertension: Secondary | ICD-10-CM | POA: Diagnosis not present

## 2016-09-06 DIAGNOSIS — F410 Panic disorder [episodic paroxysmal anxiety] without agoraphobia: Secondary | ICD-10-CM | POA: Diagnosis not present

## 2016-09-06 DIAGNOSIS — F909 Attention-deficit hyperactivity disorder, unspecified type: Secondary | ICD-10-CM | POA: Diagnosis not present

## 2016-09-07 DIAGNOSIS — I10 Essential (primary) hypertension: Secondary | ICD-10-CM | POA: Diagnosis not present

## 2016-09-07 DIAGNOSIS — F410 Panic disorder [episodic paroxysmal anxiety] without agoraphobia: Secondary | ICD-10-CM | POA: Diagnosis not present

## 2016-09-07 DIAGNOSIS — F909 Attention-deficit hyperactivity disorder, unspecified type: Secondary | ICD-10-CM | POA: Diagnosis not present

## 2016-09-07 DIAGNOSIS — E119 Type 2 diabetes mellitus without complications: Secondary | ICD-10-CM | POA: Diagnosis not present

## 2016-09-07 DIAGNOSIS — I69354 Hemiplegia and hemiparesis following cerebral infarction affecting left non-dominant side: Secondary | ICD-10-CM | POA: Diagnosis not present

## 2016-09-07 DIAGNOSIS — F339 Major depressive disorder, recurrent, unspecified: Secondary | ICD-10-CM | POA: Diagnosis not present

## 2016-09-11 DIAGNOSIS — E119 Type 2 diabetes mellitus without complications: Secondary | ICD-10-CM | POA: Diagnosis not present

## 2016-09-11 DIAGNOSIS — F339 Major depressive disorder, recurrent, unspecified: Secondary | ICD-10-CM | POA: Diagnosis not present

## 2016-09-11 DIAGNOSIS — I69354 Hemiplegia and hemiparesis following cerebral infarction affecting left non-dominant side: Secondary | ICD-10-CM | POA: Diagnosis not present

## 2016-09-11 DIAGNOSIS — F909 Attention-deficit hyperactivity disorder, unspecified type: Secondary | ICD-10-CM | POA: Diagnosis not present

## 2016-09-11 DIAGNOSIS — I10 Essential (primary) hypertension: Secondary | ICD-10-CM | POA: Diagnosis not present

## 2016-09-11 DIAGNOSIS — F410 Panic disorder [episodic paroxysmal anxiety] without agoraphobia: Secondary | ICD-10-CM | POA: Diagnosis not present

## 2016-09-12 DIAGNOSIS — F339 Major depressive disorder, recurrent, unspecified: Secondary | ICD-10-CM | POA: Diagnosis not present

## 2016-09-12 DIAGNOSIS — I69354 Hemiplegia and hemiparesis following cerebral infarction affecting left non-dominant side: Secondary | ICD-10-CM | POA: Diagnosis not present

## 2016-09-12 DIAGNOSIS — F410 Panic disorder [episodic paroxysmal anxiety] without agoraphobia: Secondary | ICD-10-CM | POA: Diagnosis not present

## 2016-09-12 DIAGNOSIS — I10 Essential (primary) hypertension: Secondary | ICD-10-CM | POA: Diagnosis not present

## 2016-09-12 DIAGNOSIS — E119 Type 2 diabetes mellitus without complications: Secondary | ICD-10-CM | POA: Diagnosis not present

## 2016-09-12 DIAGNOSIS — F909 Attention-deficit hyperactivity disorder, unspecified type: Secondary | ICD-10-CM | POA: Diagnosis not present

## 2016-09-13 DIAGNOSIS — I69354 Hemiplegia and hemiparesis following cerebral infarction affecting left non-dominant side: Secondary | ICD-10-CM | POA: Diagnosis not present

## 2016-09-13 DIAGNOSIS — F410 Panic disorder [episodic paroxysmal anxiety] without agoraphobia: Secondary | ICD-10-CM | POA: Diagnosis not present

## 2016-09-13 DIAGNOSIS — E119 Type 2 diabetes mellitus without complications: Secondary | ICD-10-CM | POA: Diagnosis not present

## 2016-09-13 DIAGNOSIS — F909 Attention-deficit hyperactivity disorder, unspecified type: Secondary | ICD-10-CM | POA: Diagnosis not present

## 2016-09-13 DIAGNOSIS — F339 Major depressive disorder, recurrent, unspecified: Secondary | ICD-10-CM | POA: Diagnosis not present

## 2016-09-13 DIAGNOSIS — I10 Essential (primary) hypertension: Secondary | ICD-10-CM | POA: Diagnosis not present

## 2016-09-14 DIAGNOSIS — I69354 Hemiplegia and hemiparesis following cerebral infarction affecting left non-dominant side: Secondary | ICD-10-CM | POA: Diagnosis not present

## 2016-09-14 DIAGNOSIS — E119 Type 2 diabetes mellitus without complications: Secondary | ICD-10-CM | POA: Diagnosis not present

## 2016-09-14 DIAGNOSIS — F410 Panic disorder [episodic paroxysmal anxiety] without agoraphobia: Secondary | ICD-10-CM | POA: Diagnosis not present

## 2016-09-14 DIAGNOSIS — F909 Attention-deficit hyperactivity disorder, unspecified type: Secondary | ICD-10-CM | POA: Diagnosis not present

## 2016-09-14 DIAGNOSIS — F339 Major depressive disorder, recurrent, unspecified: Secondary | ICD-10-CM | POA: Diagnosis not present

## 2016-09-14 DIAGNOSIS — I10 Essential (primary) hypertension: Secondary | ICD-10-CM | POA: Diagnosis not present

## 2016-09-18 DIAGNOSIS — E119 Type 2 diabetes mellitus without complications: Secondary | ICD-10-CM | POA: Diagnosis not present

## 2016-09-18 DIAGNOSIS — I10 Essential (primary) hypertension: Secondary | ICD-10-CM | POA: Diagnosis not present

## 2016-09-18 DIAGNOSIS — F410 Panic disorder [episodic paroxysmal anxiety] without agoraphobia: Secondary | ICD-10-CM | POA: Diagnosis not present

## 2016-09-18 DIAGNOSIS — F339 Major depressive disorder, recurrent, unspecified: Secondary | ICD-10-CM | POA: Diagnosis not present

## 2016-09-18 DIAGNOSIS — F909 Attention-deficit hyperactivity disorder, unspecified type: Secondary | ICD-10-CM | POA: Diagnosis not present

## 2016-09-18 DIAGNOSIS — I69354 Hemiplegia and hemiparesis following cerebral infarction affecting left non-dominant side: Secondary | ICD-10-CM | POA: Diagnosis not present

## 2016-09-19 ENCOUNTER — Telehealth: Payer: Self-pay

## 2016-09-19 ENCOUNTER — Ambulatory Visit (INDEPENDENT_AMBULATORY_CARE_PROVIDER_SITE_OTHER): Payer: Medicare Other | Admitting: Psychiatry

## 2016-09-19 ENCOUNTER — Encounter: Payer: Self-pay | Admitting: Psychiatry

## 2016-09-19 VITALS — BP 153/75 | HR 78 | Temp 97.5°F | Wt 158.6 lb

## 2016-09-19 DIAGNOSIS — F329 Major depressive disorder, single episode, unspecified: Secondary | ICD-10-CM | POA: Insufficient documentation

## 2016-09-19 DIAGNOSIS — E785 Hyperlipidemia, unspecified: Secondary | ICD-10-CM | POA: Insufficient documentation

## 2016-09-19 DIAGNOSIS — I639 Cerebral infarction, unspecified: Secondary | ICD-10-CM | POA: Insufficient documentation

## 2016-09-19 DIAGNOSIS — E119 Type 2 diabetes mellitus without complications: Secondary | ICD-10-CM | POA: Insufficient documentation

## 2016-09-19 DIAGNOSIS — F902 Attention-deficit hyperactivity disorder, combined type: Secondary | ICD-10-CM

## 2016-09-19 DIAGNOSIS — F32A Depression, unspecified: Secondary | ICD-10-CM | POA: Insufficient documentation

## 2016-09-19 MED ORDER — AMPHETAMINE-DEXTROAMPHET ER 30 MG PO CP24: 30.0000 mg | ORAL_CAPSULE | Freq: Two times a day (BID) | ORAL | 0 refills | Status: DC

## 2016-09-19 NOTE — Telephone Encounter (Signed)
received fax of the rx written to patient it was for adderall 30mg  take 1 po bid #60

## 2016-09-19 NOTE — Telephone Encounter (Signed)
Pt came into office dr. Toni Amendlapacs gave pt new rx.

## 2016-09-19 NOTE — Telephone Encounter (Signed)
spoke with pharmacy they states that they filled as tablets because it was not written on the rx that way.  they was ask to fax copy of the rx to us.

## 2016-09-19 NOTE — Telephone Encounter (Signed)
pt called states that the pharmacy gave her tablets instead of the capsules.

## 2016-09-19 NOTE — Progress Notes (Signed)
Follow-up for this 51 year old woman with a history of ADHD. No new complaints. Mood stable. Continues to report that she gets a great deal of benefit from being able to focus concentrate and get things done when she is on the medicine. She has started seeing a doctor and getting her blood pressure under control. She does report to me that a few months ago she had what she calls a "light stroke". Because of that she is taking her blood pressure and her cholesterol more seriously. No sign of any psychotic thinking no sign of any inappropriate mood. Still sleeping and eating reasonably well. Patient was given prescriptions for Adderall extended release 30 mg twice a day 3 month prescriptions follow-up in 3 months.

## 2016-09-20 DIAGNOSIS — F410 Panic disorder [episodic paroxysmal anxiety] without agoraphobia: Secondary | ICD-10-CM | POA: Diagnosis not present

## 2016-09-20 DIAGNOSIS — F339 Major depressive disorder, recurrent, unspecified: Secondary | ICD-10-CM | POA: Diagnosis not present

## 2016-09-20 DIAGNOSIS — E119 Type 2 diabetes mellitus without complications: Secondary | ICD-10-CM | POA: Diagnosis not present

## 2016-09-20 DIAGNOSIS — F909 Attention-deficit hyperactivity disorder, unspecified type: Secondary | ICD-10-CM | POA: Diagnosis not present

## 2016-09-20 DIAGNOSIS — I69354 Hemiplegia and hemiparesis following cerebral infarction affecting left non-dominant side: Secondary | ICD-10-CM | POA: Diagnosis not present

## 2016-09-20 DIAGNOSIS — I10 Essential (primary) hypertension: Secondary | ICD-10-CM | POA: Diagnosis not present

## 2016-09-25 DIAGNOSIS — F410 Panic disorder [episodic paroxysmal anxiety] without agoraphobia: Secondary | ICD-10-CM | POA: Diagnosis not present

## 2016-09-25 DIAGNOSIS — E119 Type 2 diabetes mellitus without complications: Secondary | ICD-10-CM | POA: Diagnosis not present

## 2016-09-25 DIAGNOSIS — I10 Essential (primary) hypertension: Secondary | ICD-10-CM | POA: Diagnosis not present

## 2016-09-25 DIAGNOSIS — F339 Major depressive disorder, recurrent, unspecified: Secondary | ICD-10-CM | POA: Diagnosis not present

## 2016-09-25 DIAGNOSIS — I69354 Hemiplegia and hemiparesis following cerebral infarction affecting left non-dominant side: Secondary | ICD-10-CM | POA: Diagnosis not present

## 2016-09-25 DIAGNOSIS — F909 Attention-deficit hyperactivity disorder, unspecified type: Secondary | ICD-10-CM | POA: Diagnosis not present

## 2016-09-26 DIAGNOSIS — F339 Major depressive disorder, recurrent, unspecified: Secondary | ICD-10-CM | POA: Diagnosis not present

## 2016-09-26 DIAGNOSIS — I10 Essential (primary) hypertension: Secondary | ICD-10-CM | POA: Diagnosis not present

## 2016-09-26 DIAGNOSIS — F909 Attention-deficit hyperactivity disorder, unspecified type: Secondary | ICD-10-CM | POA: Diagnosis not present

## 2016-09-26 DIAGNOSIS — I69354 Hemiplegia and hemiparesis following cerebral infarction affecting left non-dominant side: Secondary | ICD-10-CM | POA: Diagnosis not present

## 2016-09-26 DIAGNOSIS — E119 Type 2 diabetes mellitus without complications: Secondary | ICD-10-CM | POA: Diagnosis not present

## 2016-09-26 DIAGNOSIS — F410 Panic disorder [episodic paroxysmal anxiety] without agoraphobia: Secondary | ICD-10-CM | POA: Diagnosis not present

## 2016-09-29 DIAGNOSIS — E119 Type 2 diabetes mellitus without complications: Secondary | ICD-10-CM | POA: Diagnosis not present

## 2016-09-29 DIAGNOSIS — F809 Developmental disorder of speech and language, unspecified: Secondary | ICD-10-CM | POA: Diagnosis not present

## 2016-09-29 DIAGNOSIS — E785 Hyperlipidemia, unspecified: Secondary | ICD-10-CM | POA: Diagnosis not present

## 2016-09-29 DIAGNOSIS — F410 Panic disorder [episodic paroxysmal anxiety] without agoraphobia: Secondary | ICD-10-CM | POA: Diagnosis not present

## 2016-09-29 DIAGNOSIS — F339 Major depressive disorder, recurrent, unspecified: Secondary | ICD-10-CM | POA: Diagnosis not present

## 2016-09-29 DIAGNOSIS — I69354 Hemiplegia and hemiparesis following cerebral infarction affecting left non-dominant side: Secondary | ICD-10-CM | POA: Diagnosis not present

## 2016-09-29 DIAGNOSIS — F909 Attention-deficit hyperactivity disorder, unspecified type: Secondary | ICD-10-CM | POA: Diagnosis not present

## 2016-09-29 DIAGNOSIS — I639 Cerebral infarction, unspecified: Secondary | ICD-10-CM | POA: Diagnosis not present

## 2016-09-29 DIAGNOSIS — I10 Essential (primary) hypertension: Secondary | ICD-10-CM | POA: Diagnosis not present

## 2016-09-29 DIAGNOSIS — Z1231 Encounter for screening mammogram for malignant neoplasm of breast: Secondary | ICD-10-CM | POA: Diagnosis not present

## 2016-09-29 DIAGNOSIS — Z1211 Encounter for screening for malignant neoplasm of colon: Secondary | ICD-10-CM | POA: Diagnosis not present

## 2016-09-29 DIAGNOSIS — E118 Type 2 diabetes mellitus with unspecified complications: Secondary | ICD-10-CM | POA: Diagnosis not present

## 2016-09-29 DIAGNOSIS — R1310 Dysphagia, unspecified: Secondary | ICD-10-CM | POA: Diagnosis not present

## 2016-09-29 DIAGNOSIS — D696 Thrombocytopenia, unspecified: Secondary | ICD-10-CM | POA: Diagnosis not present

## 2016-10-02 DIAGNOSIS — I69354 Hemiplegia and hemiparesis following cerebral infarction affecting left non-dominant side: Secondary | ICD-10-CM | POA: Diagnosis not present

## 2016-10-02 DIAGNOSIS — F909 Attention-deficit hyperactivity disorder, unspecified type: Secondary | ICD-10-CM | POA: Diagnosis not present

## 2016-10-02 DIAGNOSIS — E119 Type 2 diabetes mellitus without complications: Secondary | ICD-10-CM | POA: Diagnosis not present

## 2016-10-02 DIAGNOSIS — F339 Major depressive disorder, recurrent, unspecified: Secondary | ICD-10-CM | POA: Diagnosis not present

## 2016-10-02 DIAGNOSIS — I10 Essential (primary) hypertension: Secondary | ICD-10-CM | POA: Diagnosis not present

## 2016-10-02 DIAGNOSIS — F410 Panic disorder [episodic paroxysmal anxiety] without agoraphobia: Secondary | ICD-10-CM | POA: Diagnosis not present

## 2016-10-05 DIAGNOSIS — F410 Panic disorder [episodic paroxysmal anxiety] without agoraphobia: Secondary | ICD-10-CM | POA: Diagnosis not present

## 2016-10-05 DIAGNOSIS — F909 Attention-deficit hyperactivity disorder, unspecified type: Secondary | ICD-10-CM | POA: Diagnosis not present

## 2016-10-05 DIAGNOSIS — E119 Type 2 diabetes mellitus without complications: Secondary | ICD-10-CM | POA: Diagnosis not present

## 2016-10-05 DIAGNOSIS — F339 Major depressive disorder, recurrent, unspecified: Secondary | ICD-10-CM | POA: Diagnosis not present

## 2016-10-05 DIAGNOSIS — I10 Essential (primary) hypertension: Secondary | ICD-10-CM | POA: Diagnosis not present

## 2016-10-05 DIAGNOSIS — I69354 Hemiplegia and hemiparesis following cerebral infarction affecting left non-dominant side: Secondary | ICD-10-CM | POA: Diagnosis not present

## 2016-10-10 DIAGNOSIS — F410 Panic disorder [episodic paroxysmal anxiety] without agoraphobia: Secondary | ICD-10-CM | POA: Diagnosis not present

## 2016-10-10 DIAGNOSIS — F339 Major depressive disorder, recurrent, unspecified: Secondary | ICD-10-CM | POA: Diagnosis not present

## 2016-10-10 DIAGNOSIS — E119 Type 2 diabetes mellitus without complications: Secondary | ICD-10-CM | POA: Diagnosis not present

## 2016-10-10 DIAGNOSIS — I69354 Hemiplegia and hemiparesis following cerebral infarction affecting left non-dominant side: Secondary | ICD-10-CM | POA: Diagnosis not present

## 2016-10-10 DIAGNOSIS — F909 Attention-deficit hyperactivity disorder, unspecified type: Secondary | ICD-10-CM | POA: Diagnosis not present

## 2016-10-10 DIAGNOSIS — I10 Essential (primary) hypertension: Secondary | ICD-10-CM | POA: Diagnosis not present

## 2016-10-11 DIAGNOSIS — I69354 Hemiplegia and hemiparesis following cerebral infarction affecting left non-dominant side: Secondary | ICD-10-CM | POA: Diagnosis not present

## 2016-10-11 DIAGNOSIS — F909 Attention-deficit hyperactivity disorder, unspecified type: Secondary | ICD-10-CM | POA: Diagnosis not present

## 2016-10-11 DIAGNOSIS — F410 Panic disorder [episodic paroxysmal anxiety] without agoraphobia: Secondary | ICD-10-CM | POA: Diagnosis not present

## 2016-10-11 DIAGNOSIS — E119 Type 2 diabetes mellitus without complications: Secondary | ICD-10-CM | POA: Diagnosis not present

## 2016-10-11 DIAGNOSIS — I10 Essential (primary) hypertension: Secondary | ICD-10-CM | POA: Diagnosis not present

## 2016-10-11 DIAGNOSIS — F339 Major depressive disorder, recurrent, unspecified: Secondary | ICD-10-CM | POA: Diagnosis not present

## 2016-10-12 DIAGNOSIS — G4700 Insomnia, unspecified: Secondary | ICD-10-CM | POA: Diagnosis not present

## 2016-10-12 DIAGNOSIS — F339 Major depressive disorder, recurrent, unspecified: Secondary | ICD-10-CM | POA: Diagnosis not present

## 2016-10-12 DIAGNOSIS — E785 Hyperlipidemia, unspecified: Secondary | ICD-10-CM | POA: Diagnosis not present

## 2016-10-12 DIAGNOSIS — F410 Panic disorder [episodic paroxysmal anxiety] without agoraphobia: Secondary | ICD-10-CM | POA: Diagnosis not present

## 2016-10-12 DIAGNOSIS — Z7984 Long term (current) use of oral hypoglycemic drugs: Secondary | ICD-10-CM | POA: Diagnosis not present

## 2016-10-12 DIAGNOSIS — Z9181 History of falling: Secondary | ICD-10-CM | POA: Diagnosis not present

## 2016-10-12 DIAGNOSIS — I10 Essential (primary) hypertension: Secondary | ICD-10-CM | POA: Diagnosis not present

## 2016-10-12 DIAGNOSIS — Z7982 Long term (current) use of aspirin: Secondary | ICD-10-CM | POA: Diagnosis not present

## 2016-10-12 DIAGNOSIS — F909 Attention-deficit hyperactivity disorder, unspecified type: Secondary | ICD-10-CM | POA: Diagnosis not present

## 2016-10-12 DIAGNOSIS — E119 Type 2 diabetes mellitus without complications: Secondary | ICD-10-CM | POA: Diagnosis not present

## 2016-10-12 DIAGNOSIS — I69354 Hemiplegia and hemiparesis following cerebral infarction affecting left non-dominant side: Secondary | ICD-10-CM | POA: Diagnosis not present

## 2016-10-17 DIAGNOSIS — I69354 Hemiplegia and hemiparesis following cerebral infarction affecting left non-dominant side: Secondary | ICD-10-CM | POA: Diagnosis not present

## 2016-10-17 DIAGNOSIS — I10 Essential (primary) hypertension: Secondary | ICD-10-CM | POA: Diagnosis not present

## 2016-10-17 DIAGNOSIS — E119 Type 2 diabetes mellitus without complications: Secondary | ICD-10-CM | POA: Diagnosis not present

## 2016-10-17 DIAGNOSIS — F909 Attention-deficit hyperactivity disorder, unspecified type: Secondary | ICD-10-CM | POA: Diagnosis not present

## 2016-10-17 DIAGNOSIS — F410 Panic disorder [episodic paroxysmal anxiety] without agoraphobia: Secondary | ICD-10-CM | POA: Diagnosis not present

## 2016-10-17 DIAGNOSIS — S300XXA Contusion of lower back and pelvis, initial encounter: Secondary | ICD-10-CM | POA: Diagnosis not present

## 2016-10-17 DIAGNOSIS — F339 Major depressive disorder, recurrent, unspecified: Secondary | ICD-10-CM | POA: Diagnosis not present

## 2016-10-19 DIAGNOSIS — E119 Type 2 diabetes mellitus without complications: Secondary | ICD-10-CM | POA: Diagnosis not present

## 2016-10-19 DIAGNOSIS — I69354 Hemiplegia and hemiparesis following cerebral infarction affecting left non-dominant side: Secondary | ICD-10-CM | POA: Diagnosis not present

## 2016-10-19 DIAGNOSIS — I10 Essential (primary) hypertension: Secondary | ICD-10-CM | POA: Diagnosis not present

## 2016-10-19 DIAGNOSIS — F410 Panic disorder [episodic paroxysmal anxiety] without agoraphobia: Secondary | ICD-10-CM | POA: Diagnosis not present

## 2016-10-19 DIAGNOSIS — F909 Attention-deficit hyperactivity disorder, unspecified type: Secondary | ICD-10-CM | POA: Diagnosis not present

## 2016-10-19 DIAGNOSIS — F339 Major depressive disorder, recurrent, unspecified: Secondary | ICD-10-CM | POA: Diagnosis not present

## 2016-10-24 DIAGNOSIS — Z1231 Encounter for screening mammogram for malignant neoplasm of breast: Secondary | ICD-10-CM | POA: Diagnosis not present

## 2016-10-26 DIAGNOSIS — E119 Type 2 diabetes mellitus without complications: Secondary | ICD-10-CM | POA: Diagnosis not present

## 2016-10-26 DIAGNOSIS — F909 Attention-deficit hyperactivity disorder, unspecified type: Secondary | ICD-10-CM | POA: Diagnosis not present

## 2016-10-26 DIAGNOSIS — I10 Essential (primary) hypertension: Secondary | ICD-10-CM | POA: Diagnosis not present

## 2016-10-26 DIAGNOSIS — F339 Major depressive disorder, recurrent, unspecified: Secondary | ICD-10-CM | POA: Diagnosis not present

## 2016-10-26 DIAGNOSIS — I69354 Hemiplegia and hemiparesis following cerebral infarction affecting left non-dominant side: Secondary | ICD-10-CM | POA: Diagnosis not present

## 2016-10-26 DIAGNOSIS — F410 Panic disorder [episodic paroxysmal anxiety] without agoraphobia: Secondary | ICD-10-CM | POA: Diagnosis not present

## 2016-10-28 DIAGNOSIS — I69354 Hemiplegia and hemiparesis following cerebral infarction affecting left non-dominant side: Secondary | ICD-10-CM | POA: Diagnosis not present

## 2016-10-28 DIAGNOSIS — F410 Panic disorder [episodic paroxysmal anxiety] without agoraphobia: Secondary | ICD-10-CM | POA: Diagnosis not present

## 2016-10-28 DIAGNOSIS — E119 Type 2 diabetes mellitus without complications: Secondary | ICD-10-CM | POA: Diagnosis not present

## 2016-10-28 DIAGNOSIS — F339 Major depressive disorder, recurrent, unspecified: Secondary | ICD-10-CM | POA: Diagnosis not present

## 2016-10-28 DIAGNOSIS — I10 Essential (primary) hypertension: Secondary | ICD-10-CM | POA: Diagnosis not present

## 2016-10-28 DIAGNOSIS — F909 Attention-deficit hyperactivity disorder, unspecified type: Secondary | ICD-10-CM | POA: Diagnosis not present

## 2016-10-30 DIAGNOSIS — I69354 Hemiplegia and hemiparesis following cerebral infarction affecting left non-dominant side: Secondary | ICD-10-CM | POA: Diagnosis not present

## 2016-10-30 DIAGNOSIS — F410 Panic disorder [episodic paroxysmal anxiety] without agoraphobia: Secondary | ICD-10-CM | POA: Diagnosis not present

## 2016-10-30 DIAGNOSIS — E119 Type 2 diabetes mellitus without complications: Secondary | ICD-10-CM | POA: Diagnosis not present

## 2016-10-30 DIAGNOSIS — F909 Attention-deficit hyperactivity disorder, unspecified type: Secondary | ICD-10-CM | POA: Diagnosis not present

## 2016-10-30 DIAGNOSIS — F339 Major depressive disorder, recurrent, unspecified: Secondary | ICD-10-CM | POA: Diagnosis not present

## 2016-10-30 DIAGNOSIS — I10 Essential (primary) hypertension: Secondary | ICD-10-CM | POA: Diagnosis not present

## 2016-10-31 DIAGNOSIS — I10 Essential (primary) hypertension: Secondary | ICD-10-CM | POA: Diagnosis not present

## 2016-10-31 DIAGNOSIS — F410 Panic disorder [episodic paroxysmal anxiety] without agoraphobia: Secondary | ICD-10-CM | POA: Diagnosis not present

## 2016-10-31 DIAGNOSIS — I69354 Hemiplegia and hemiparesis following cerebral infarction affecting left non-dominant side: Secondary | ICD-10-CM | POA: Diagnosis not present

## 2016-10-31 DIAGNOSIS — F339 Major depressive disorder, recurrent, unspecified: Secondary | ICD-10-CM | POA: Diagnosis not present

## 2016-10-31 DIAGNOSIS — E119 Type 2 diabetes mellitus without complications: Secondary | ICD-10-CM | POA: Diagnosis not present

## 2016-10-31 DIAGNOSIS — F909 Attention-deficit hyperactivity disorder, unspecified type: Secondary | ICD-10-CM | POA: Diagnosis not present

## 2016-11-02 DIAGNOSIS — F410 Panic disorder [episodic paroxysmal anxiety] without agoraphobia: Secondary | ICD-10-CM | POA: Diagnosis not present

## 2016-11-02 DIAGNOSIS — I69354 Hemiplegia and hemiparesis following cerebral infarction affecting left non-dominant side: Secondary | ICD-10-CM | POA: Diagnosis not present

## 2016-11-02 DIAGNOSIS — F909 Attention-deficit hyperactivity disorder, unspecified type: Secondary | ICD-10-CM | POA: Diagnosis not present

## 2016-11-02 DIAGNOSIS — I10 Essential (primary) hypertension: Secondary | ICD-10-CM | POA: Diagnosis not present

## 2016-11-02 DIAGNOSIS — F339 Major depressive disorder, recurrent, unspecified: Secondary | ICD-10-CM | POA: Diagnosis not present

## 2016-11-02 DIAGNOSIS — E119 Type 2 diabetes mellitus without complications: Secondary | ICD-10-CM | POA: Diagnosis not present

## 2016-11-03 DIAGNOSIS — Z9181 History of falling: Secondary | ICD-10-CM | POA: Insufficient documentation

## 2016-11-03 DIAGNOSIS — R748 Abnormal levels of other serum enzymes: Secondary | ICD-10-CM | POA: Diagnosis not present

## 2016-11-03 DIAGNOSIS — Z0000 Encounter for general adult medical examination without abnormal findings: Secondary | ICD-10-CM | POA: Diagnosis not present

## 2016-11-03 DIAGNOSIS — K760 Fatty (change of) liver, not elsewhere classified: Secondary | ICD-10-CM | POA: Diagnosis not present

## 2016-11-06 DIAGNOSIS — I10 Essential (primary) hypertension: Secondary | ICD-10-CM | POA: Diagnosis not present

## 2016-11-06 DIAGNOSIS — F339 Major depressive disorder, recurrent, unspecified: Secondary | ICD-10-CM | POA: Diagnosis not present

## 2016-11-06 DIAGNOSIS — F410 Panic disorder [episodic paroxysmal anxiety] without agoraphobia: Secondary | ICD-10-CM | POA: Diagnosis not present

## 2016-11-06 DIAGNOSIS — F909 Attention-deficit hyperactivity disorder, unspecified type: Secondary | ICD-10-CM | POA: Diagnosis not present

## 2016-11-06 DIAGNOSIS — E119 Type 2 diabetes mellitus without complications: Secondary | ICD-10-CM | POA: Diagnosis not present

## 2016-11-06 DIAGNOSIS — I69354 Hemiplegia and hemiparesis following cerebral infarction affecting left non-dominant side: Secondary | ICD-10-CM | POA: Diagnosis not present

## 2016-11-08 DIAGNOSIS — E119 Type 2 diabetes mellitus without complications: Secondary | ICD-10-CM | POA: Diagnosis not present

## 2016-11-08 DIAGNOSIS — I10 Essential (primary) hypertension: Secondary | ICD-10-CM | POA: Diagnosis not present

## 2016-11-08 DIAGNOSIS — F909 Attention-deficit hyperactivity disorder, unspecified type: Secondary | ICD-10-CM | POA: Diagnosis not present

## 2016-11-08 DIAGNOSIS — F410 Panic disorder [episodic paroxysmal anxiety] without agoraphobia: Secondary | ICD-10-CM | POA: Diagnosis not present

## 2016-11-08 DIAGNOSIS — F339 Major depressive disorder, recurrent, unspecified: Secondary | ICD-10-CM | POA: Diagnosis not present

## 2016-11-08 DIAGNOSIS — I69354 Hemiplegia and hemiparesis following cerebral infarction affecting left non-dominant side: Secondary | ICD-10-CM | POA: Diagnosis not present

## 2016-11-14 DIAGNOSIS — I10 Essential (primary) hypertension: Secondary | ICD-10-CM | POA: Diagnosis not present

## 2016-11-14 DIAGNOSIS — F410 Panic disorder [episodic paroxysmal anxiety] without agoraphobia: Secondary | ICD-10-CM | POA: Diagnosis not present

## 2016-11-14 DIAGNOSIS — F909 Attention-deficit hyperactivity disorder, unspecified type: Secondary | ICD-10-CM | POA: Diagnosis not present

## 2016-11-14 DIAGNOSIS — I69354 Hemiplegia and hemiparesis following cerebral infarction affecting left non-dominant side: Secondary | ICD-10-CM | POA: Diagnosis not present

## 2016-11-14 DIAGNOSIS — E119 Type 2 diabetes mellitus without complications: Secondary | ICD-10-CM | POA: Diagnosis not present

## 2016-11-14 DIAGNOSIS — F339 Major depressive disorder, recurrent, unspecified: Secondary | ICD-10-CM | POA: Diagnosis not present

## 2016-11-16 ENCOUNTER — Other Ambulatory Visit: Payer: Self-pay | Admitting: Specialist

## 2016-11-16 DIAGNOSIS — R1312 Dysphagia, oropharyngeal phase: Secondary | ICD-10-CM

## 2016-11-21 DIAGNOSIS — I10 Essential (primary) hypertension: Secondary | ICD-10-CM | POA: Diagnosis not present

## 2016-11-21 DIAGNOSIS — F909 Attention-deficit hyperactivity disorder, unspecified type: Secondary | ICD-10-CM | POA: Diagnosis not present

## 2016-11-21 DIAGNOSIS — F339 Major depressive disorder, recurrent, unspecified: Secondary | ICD-10-CM | POA: Diagnosis not present

## 2016-11-21 DIAGNOSIS — I69354 Hemiplegia and hemiparesis following cerebral infarction affecting left non-dominant side: Secondary | ICD-10-CM | POA: Diagnosis not present

## 2016-11-21 DIAGNOSIS — F410 Panic disorder [episodic paroxysmal anxiety] without agoraphobia: Secondary | ICD-10-CM | POA: Diagnosis not present

## 2016-11-21 DIAGNOSIS — E119 Type 2 diabetes mellitus without complications: Secondary | ICD-10-CM | POA: Diagnosis not present

## 2016-11-22 ENCOUNTER — Ambulatory Visit: Admission: RE | Admit: 2016-11-22 | Payer: Medicare Other | Source: Ambulatory Visit

## 2016-12-05 DIAGNOSIS — E119 Type 2 diabetes mellitus without complications: Secondary | ICD-10-CM | POA: Diagnosis not present

## 2016-12-05 DIAGNOSIS — I10 Essential (primary) hypertension: Secondary | ICD-10-CM | POA: Diagnosis not present

## 2016-12-05 DIAGNOSIS — I69354 Hemiplegia and hemiparesis following cerebral infarction affecting left non-dominant side: Secondary | ICD-10-CM | POA: Diagnosis not present

## 2016-12-05 DIAGNOSIS — F339 Major depressive disorder, recurrent, unspecified: Secondary | ICD-10-CM | POA: Diagnosis not present

## 2016-12-05 DIAGNOSIS — F410 Panic disorder [episodic paroxysmal anxiety] without agoraphobia: Secondary | ICD-10-CM | POA: Diagnosis not present

## 2016-12-05 DIAGNOSIS — F909 Attention-deficit hyperactivity disorder, unspecified type: Secondary | ICD-10-CM | POA: Diagnosis not present

## 2016-12-14 ENCOUNTER — Ambulatory Visit (INDEPENDENT_AMBULATORY_CARE_PROVIDER_SITE_OTHER): Payer: Medicare Other | Admitting: Psychiatry

## 2016-12-14 ENCOUNTER — Encounter: Payer: Self-pay | Admitting: Psychiatry

## 2016-12-14 VITALS — BP 158/96 | HR 84 | Wt 154.0 lb

## 2016-12-14 DIAGNOSIS — F902 Attention-deficit hyperactivity disorder, combined type: Secondary | ICD-10-CM

## 2016-12-14 NOTE — Progress Notes (Signed)
Follow-up for this 51 year old woman with ADHD. No specific new complaints except for feeling like she has a cold today. No mood problems. Continues to have stable mood without any depression. Functions fairly well most of the time. Denies any hallucinations denies racing thoughts denies agitation. Sleeps well at night. Appetite is fine.  Neatly dressed and groomed woman. Eye contact and normal psychomotor activity a little fidgety but that's normal for her. Thoughts generally lucid no sign of delusional or bizarre thinking. No suicidal thoughts.  Tolerating medicine well. Reviewed medication plan. 3 new prescriptions written each one for a month with appropriate dates for fill included. Patient can follow-up in 3 months.

## 2017-03-06 ENCOUNTER — Other Ambulatory Visit: Payer: Self-pay

## 2017-03-06 ENCOUNTER — Encounter: Payer: Self-pay | Admitting: Psychiatry

## 2017-03-06 ENCOUNTER — Ambulatory Visit (INDEPENDENT_AMBULATORY_CARE_PROVIDER_SITE_OTHER): Payer: Medicare Other | Admitting: Psychiatry

## 2017-03-06 VITALS — BP 149/96 | HR 74 | Temp 97.7°F | Wt 156.6 lb

## 2017-03-06 DIAGNOSIS — F902 Attention-deficit hyperactivity disorder, combined type: Secondary | ICD-10-CM

## 2017-03-06 NOTE — Progress Notes (Signed)
Follow-up patient with ADHD.  No new complaints.  Tolerating medicine well.  She does have elevated blood pressure but it is not as bad as it has been in the past.  Patient is neatly dressed and groomed.  Thoughts lucid.  No agitation no psychosis.  Reviewed with patient the utility of medication.  Psychoeducation completed.  Refilled prescriptions with notes that she should not need to get any of them filled until the first week of December.  Follow-up in 3 months.

## 2017-06-05 ENCOUNTER — Encounter: Payer: Self-pay | Admitting: Psychiatry

## 2017-06-05 ENCOUNTER — Ambulatory Visit (INDEPENDENT_AMBULATORY_CARE_PROVIDER_SITE_OTHER): Payer: Medicare Other | Admitting: Psychiatry

## 2017-06-05 ENCOUNTER — Other Ambulatory Visit: Payer: Self-pay

## 2017-06-05 VITALS — BP 137/86 | HR 78 | Temp 97.6°F | Wt 159.2 lb

## 2017-06-05 DIAGNOSIS — F902 Attention-deficit hyperactivity disorder, combined type: Secondary | ICD-10-CM

## 2017-06-05 DIAGNOSIS — F411 Generalized anxiety disorder: Secondary | ICD-10-CM

## 2017-06-05 MED ORDER — AMPHETAMINE-DEXTROAMPHET ER 30 MG PO CP24: 30.0000 mg | ORAL_CAPSULE | Freq: Two times a day (BID) | ORAL | 0 refills | Status: DC

## 2017-06-05 NOTE — Progress Notes (Signed)
Follow-up 52 year old woman with ADHD and anxiety.  Patient's boyfriend has been diagnosed with colon cancer.  She wants to talk today about how stressful this has been for her.  Appropriate emotion.  Coping well.  Personally she has no specific new complaint.  Chronic anxiety.  Medication however continues to function adequately with helping her stay functional and alert.  Casually but reasonably neatly dressed.  Adequate eye contact.  On time for appointment.  Speech a little bit more quiet than usual.  Affect anxious.  Thoughts lucid no sign of delusional thinking.  No sign of dangerousness.  Supportive counseling.  No change to current medication.  Renewed everything electronically.  Follow-up 3 months.

## 2017-08-28 ENCOUNTER — Other Ambulatory Visit: Payer: Self-pay

## 2017-08-28 ENCOUNTER — Ambulatory Visit (INDEPENDENT_AMBULATORY_CARE_PROVIDER_SITE_OTHER): Payer: Medicare Other | Admitting: Psychiatry

## 2017-08-28 ENCOUNTER — Encounter: Payer: Self-pay | Admitting: Psychiatry

## 2017-08-28 VITALS — BP 132/86 | HR 68 | Temp 97.8°F | Wt 164.0 lb

## 2017-08-28 DIAGNOSIS — F411 Generalized anxiety disorder: Secondary | ICD-10-CM | POA: Diagnosis not present

## 2017-08-28 DIAGNOSIS — F902 Attention-deficit hyperactivity disorder, combined type: Secondary | ICD-10-CM | POA: Diagnosis not present

## 2017-08-28 DIAGNOSIS — K760 Fatty (change of) liver, not elsewhere classified: Secondary | ICD-10-CM | POA: Insufficient documentation

## 2017-08-28 MED ORDER — AMPHETAMINE-DEXTROAMPHET ER 30 MG PO CP24: 30.0000 mg | ORAL_CAPSULE | Freq: Two times a day (BID) | ORAL | 0 refills | Status: DC

## 2017-08-28 NOTE — Progress Notes (Signed)
Follow-up for this 52 year old woman with ADHD.  No new complaints.  Mood has been stable recently.  Sleeping fairly well.  Not feeling overly anxious or depressed.  No new stressors to report.  Patient continues to have improved focus and ability to function in the daytime with her medicine.  He tells me that she has been back to her doctor and that she has changed blood pressure medicine and thinks that is under better control.  Neatly dressed and groomed woman looks her stated age.  Eye contact normal psychomotor activity normal.  Thoughts lucid no evidence of loosening of associations or delusions.  No evidence of psychosis or dangerousness.  Reviewed medication usage and doses.  Refill medicine and follow-up in 3 months.

## 2017-11-22 ENCOUNTER — Ambulatory Visit (INDEPENDENT_AMBULATORY_CARE_PROVIDER_SITE_OTHER): Payer: Medicare Other | Admitting: Psychiatry

## 2017-11-22 ENCOUNTER — Other Ambulatory Visit: Payer: Self-pay

## 2017-11-22 ENCOUNTER — Encounter: Payer: Self-pay | Admitting: Psychiatry

## 2017-11-22 VITALS — BP 139/81 | HR 66

## 2017-11-22 DIAGNOSIS — F902 Attention-deficit hyperactivity disorder, combined type: Secondary | ICD-10-CM

## 2017-11-22 DIAGNOSIS — F411 Generalized anxiety disorder: Secondary | ICD-10-CM | POA: Diagnosis not present

## 2017-11-22 MED ORDER — AMPHETAMINE-DEXTROAMPHET ER 30 MG PO CP24: 30.0000 mg | ORAL_CAPSULE | Freq: Two times a day (BID) | ORAL | 0 refills | Status: DC

## 2017-11-22 NOTE — Progress Notes (Signed)
Follow-up for this 52 year old woman with ADHD and mood instability.  Recently has been under a lot of stress.  Her partner has been getting treatment for cancer and she has been helping to take care of him.  She is feeling very tired and run down.  However there is no sign of any psychotic symptoms.  Still sleeping adequately.  Appetite fine.  Medication still seems to be of benefit in keeping her more organized in her thoughts.  Casually dressed.  Thoughts generally lucid.  She has a little more slurring of her speech since having had a stroke particularly when she is tired.  No sign of acute dangerousness.  Review side effects and use of medication.  Seems to be getting benefit from it at this dosage.  Continue current medications new orders written.  Follow-up in another 3 months.

## 2017-11-26 ENCOUNTER — Telehealth: Payer: Self-pay

## 2017-11-26 ENCOUNTER — Other Ambulatory Visit: Payer: Self-pay | Admitting: Psychiatry

## 2017-11-26 MED ORDER — AMPHETAMINE-DEXTROAMPHET ER 30 MG PO CP24: 30.0000 mg | ORAL_CAPSULE | Freq: Two times a day (BID) | ORAL | 0 refills | Status: DC

## 2017-11-26 NOTE — Telephone Encounter (Signed)
called pharmacy and they confirmed that they did NOT have the rx's for the adderall I gave them the date and time and they stated that they were having computer issues that day.  the pharmacist asked if you would resent the rxs for the patient.

## 2017-11-26 NOTE — Telephone Encounter (Signed)
states that [facility] did not have the rx for the adderall s

## 2017-12-13 NOTE — Telephone Encounter (Signed)
amphetamine-dextroamphetamine (ADDERALL XR) 30 MG 24 hr capsule  Medication  Date: 11/26/2017 Department: [facility] Ordering/Authorizing: Clapacs, Jackquline Denmark, MD  Order Providers   Prescribing Provider Encounter Provider  Clapacs, Jackquline Denmark, MD Clapacs, Jackquline Denmark, MD  Outpatient Medication Detail    Disp Refills Start End   amphetamine-dextroamphetamine (ADDERALL XR) 30 MG 24 hr capsule 60 capsule 0 11/26/2017    Sig - Route: Take 1 capsule (30 mg total) by mouth 2 (two) times daily. - Oral   Sent to pharmacy as: amphetamine-dextroamphetamine (ADDERALL XR) 30 MG 24 hr capsule   Earliest Fill Date: 11/26/2017   E-Prescribing Status: Receipt confirmed by pharmacy (11/26/2017 7:41 PM EDT)

## 2018-02-01 DIAGNOSIS — R87810 Cervical high risk human papillomavirus (HPV) DNA test positive: Secondary | ICD-10-CM

## 2018-02-01 DIAGNOSIS — R87610 Atypical squamous cells of undetermined significance on cytologic smear of cervix (ASC-US): Secondary | ICD-10-CM | POA: Insufficient documentation

## 2018-02-01 DIAGNOSIS — I10 Essential (primary) hypertension: Secondary | ICD-10-CM | POA: Insufficient documentation

## 2018-02-21 ENCOUNTER — Encounter: Payer: Self-pay | Admitting: Psychiatry

## 2018-02-21 ENCOUNTER — Ambulatory Visit (INDEPENDENT_AMBULATORY_CARE_PROVIDER_SITE_OTHER): Payer: Medicare Other | Admitting: Psychiatry

## 2018-02-21 ENCOUNTER — Other Ambulatory Visit: Payer: Self-pay

## 2018-02-21 VITALS — BP 165/81 | HR 71 | Temp 97.6°F | Wt 169.4 lb

## 2018-02-21 DIAGNOSIS — F902 Attention-deficit hyperactivity disorder, combined type: Secondary | ICD-10-CM

## 2018-02-21 MED ORDER — AMPHETAMINE-DEXTROAMPHET ER 30 MG PO CP24: 30.0000 mg | ORAL_CAPSULE | Freq: Two times a day (BID) | ORAL | 0 refills | Status: DC

## 2018-02-21 NOTE — Progress Notes (Signed)
Follow-up patient with ADHD.  Patient has no specific complaints.  Says that she still feels frustrated by her "life in general".  Not feeling depressed.  Sleeps adequately.  Denies depression or anger problems.  On examination the patient is a little scattered but easily redirectable and overall stays on topic.  No evidence of delusions or bizarre thinking.  Denies hallucinations.  Denies suicidal or homicidal thought.  Encourage patient to continue exercising staying on a regular schedule and taking care of her self.  Reviewed treatment plan.  Renew Adderall for another 3 months.

## 2018-04-18 ENCOUNTER — Ambulatory Visit (INDEPENDENT_AMBULATORY_CARE_PROVIDER_SITE_OTHER): Payer: Medicare Other | Admitting: Psychiatry

## 2018-04-18 ENCOUNTER — Other Ambulatory Visit: Payer: Self-pay

## 2018-04-18 ENCOUNTER — Encounter: Payer: Self-pay | Admitting: Psychiatry

## 2018-04-18 VITALS — BP 145/79 | HR 79 | Temp 97.5°F | Wt 173.0 lb

## 2018-04-18 DIAGNOSIS — F902 Attention-deficit hyperactivity disorder, combined type: Secondary | ICD-10-CM | POA: Diagnosis not present

## 2018-05-21 ENCOUNTER — Telehealth: Payer: Self-pay

## 2018-05-21 NOTE — Telephone Encounter (Signed)
pt left message that she needs refills on her adderall.

## 2018-05-22 NOTE — Telephone Encounter (Signed)
pt called left message that she needs refills on her adderall 

## 2018-05-23 ENCOUNTER — Other Ambulatory Visit: Payer: Self-pay | Admitting: Psychiatry

## 2018-05-23 MED ORDER — AMPHETAMINE-DEXTROAMPHET ER 30 MG PO CP24: 30.0000 mg | ORAL_CAPSULE | Freq: Two times a day (BID) | ORAL | 0 refills | Status: DC

## 2018-05-23 NOTE — Telephone Encounter (Signed)
done

## 2018-05-29 ENCOUNTER — Telehealth: Payer: Self-pay

## 2018-05-29 ENCOUNTER — Other Ambulatory Visit: Payer: Self-pay | Admitting: Psychiatry

## 2018-05-29 MED ORDER — AMPHETAMINE-DEXTROAMPHET ER 30 MG PO CP24: 30.0000 mg | ORAL_CAPSULE | Freq: Two times a day (BID) | ORAL | 0 refills | Status: DC

## 2018-05-29 NOTE — Telephone Encounter (Signed)
pharmacy called states that they need a new rx for adderall xr since the patient did not received all of the rx. they did not have enough pills to give patient so they gave her what they had but pt will need a new rx for remaining rx.

## 2018-05-29 NOTE — Telephone Encounter (Signed)
Done

## 2018-08-15 ENCOUNTER — Telehealth: Payer: Self-pay

## 2018-08-15 DIAGNOSIS — F901 Attention-deficit hyperactivity disorder, predominantly hyperactive type: Secondary | ICD-10-CM

## 2018-08-15 MED ORDER — AMPHETAMINE-DEXTROAMPHET ER 30 MG PO CP24: 30.0000 mg | ORAL_CAPSULE | Freq: Two times a day (BID) | ORAL | 0 refills | Status: DC

## 2018-08-15 NOTE — Telephone Encounter (Signed)
Sent adderall to be filled - 30 days supply with date specified

## 2018-08-15 NOTE — Telephone Encounter (Signed)
pt needs enough adderall to get to her appt with dr. Toni Amend on 08-22-18

## 2018-08-22 ENCOUNTER — Ambulatory Visit: Payer: Medicare Other | Admitting: Psychiatry

## 2018-08-22 ENCOUNTER — Other Ambulatory Visit: Payer: Self-pay

## 2018-08-22 NOTE — Progress Notes (Unsigned)
I called all 3 numbers.  At 1 of them I left a voicemail message.  The other one did not have a voicemail available.  The one listed as being "at work" the patient answered the phone but appeared to be unaware that we had any sort of appointment and then hung up on me.  I will wait until we get some contact from her before refilling anything.

## 2018-08-29 ENCOUNTER — Ambulatory Visit (INDEPENDENT_AMBULATORY_CARE_PROVIDER_SITE_OTHER): Payer: Medicare Other | Admitting: Psychiatry

## 2018-08-29 ENCOUNTER — Encounter: Payer: Self-pay | Admitting: Psychiatry

## 2018-08-29 ENCOUNTER — Other Ambulatory Visit: Payer: Self-pay

## 2018-08-29 DIAGNOSIS — F901 Attention-deficit hyperactivity disorder, predominantly hyperactive type: Secondary | ICD-10-CM | POA: Diagnosis not present

## 2018-08-29 MED ORDER — AMPHETAMINE-DEXTROAMPHET ER 30 MG PO CP24: 30.0000 mg | ORAL_CAPSULE | Freq: Two times a day (BID) | ORAL | 0 refills | Status: DC

## 2018-08-29 NOTE — Progress Notes (Signed)
Follow-up telephone appointment for this 53 year old woman with ADHD.  Contacted patient by telephone.  Reviewed the fact that we have to use these telephone appointments for now.  Patient was agreeable.  She has no new complaints.  She says that being inside all the time is boring but otherwise she is doing okay.  Denies feeling depressed.  Denies change in anxiety level.  Denies any sleep or appetite problems.  Says her concentration and focus remain adequate with the current medication.  Patient was awake alert and oriented.  Did not seem to be delirious or disorganized.  Denied suicidal or homicidal ideation.  Seemed able to understand medication usage and make reasonable decisions.  Dr. Elna Breslow had called in a full 1 month prescription to begin on 10 May so I will send in 2 more prescriptions at this time that will only be filled when that one expires.  Patient reminded to make follow-up appointment in 3 months.  No other change of plan at this time.

## 2018-11-14 ENCOUNTER — Other Ambulatory Visit: Payer: Self-pay

## 2018-11-14 ENCOUNTER — Encounter: Payer: Self-pay | Admitting: Psychiatry

## 2018-11-14 ENCOUNTER — Ambulatory Visit (INDEPENDENT_AMBULATORY_CARE_PROVIDER_SITE_OTHER): Payer: Medicare Other | Admitting: Psychiatry

## 2018-11-14 DIAGNOSIS — F901 Attention-deficit hyperactivity disorder, predominantly hyperactive type: Secondary | ICD-10-CM

## 2018-11-14 MED ORDER — AMPHETAMINE-DEXTROAMPHET ER 30 MG PO CP24: 30.0000 mg | ORAL_CAPSULE | Freq: Two times a day (BID) | ORAL | 0 refills | Status: DC

## 2018-11-14 NOTE — Progress Notes (Signed)
Patient seen by telephone.  Follow-up appointment for patient with ADHD.  Patient was appropriate in conversation.  No specific new complaints.  She mentions that her sister died in 10/17/22.  This has been hard for her as she was very close with her sister.  She is trying to stay in close contact with other family and is using her best coping mechanisms at home to deal with her grief.  Overall level of activity is very limited.  She is frightened of the coronavirus and does not go out very much.  Although she has a grief she is not persistently depressed.  No problems with eating or sleeping.  No suicidal thoughts.  Continues to find Adderall helpful with attention and focus.  Patient's voice was appropriate and understandable.  Affect appropriate and reactive.  Thoughts lucid.  No evident psychosis or disorganized thinking.  Appropriate comments.  No suicidality.  Discussed medication use and side effects.  Reviewed overall treatment plan.  Prescriptions refilled for Adderall.  We will make contact again in another 3 months.

## 2018-11-30 ENCOUNTER — Encounter: Payer: Self-pay | Admitting: Emergency Medicine

## 2018-11-30 ENCOUNTER — Other Ambulatory Visit: Payer: Self-pay

## 2018-11-30 ENCOUNTER — Emergency Department: Payer: Medicare Other

## 2018-11-30 ENCOUNTER — Emergency Department
Admission: EM | Admit: 2018-11-30 | Discharge: 2018-11-30 | Disposition: A | Discharge status: Home / Self Care | Payer: Medicare Other | Attending: Emergency Medicine | Admitting: Emergency Medicine

## 2018-11-30 DIAGNOSIS — Z79899 Other long term (current) drug therapy: Secondary | ICD-10-CM | POA: Insufficient documentation

## 2018-11-30 DIAGNOSIS — E119 Type 2 diabetes mellitus without complications: Secondary | ICD-10-CM | POA: Diagnosis not present

## 2018-11-30 DIAGNOSIS — R1031 Right lower quadrant pain: Secondary | ICD-10-CM | POA: Diagnosis not present

## 2018-11-30 DIAGNOSIS — I10 Essential (primary) hypertension: Secondary | ICD-10-CM | POA: Diagnosis not present

## 2018-11-30 DIAGNOSIS — Z87891 Personal history of nicotine dependence: Secondary | ICD-10-CM | POA: Insufficient documentation

## 2018-11-30 DIAGNOSIS — Z7982 Long term (current) use of aspirin: Secondary | ICD-10-CM | POA: Diagnosis not present

## 2018-11-30 DIAGNOSIS — Z7984 Long term (current) use of oral hypoglycemic drugs: Secondary | ICD-10-CM | POA: Diagnosis not present

## 2018-11-30 DIAGNOSIS — K7469 Other cirrhosis of liver: Secondary | ICD-10-CM | POA: Diagnosis not present

## 2018-11-30 LAB — COMPREHENSIVE METABOLIC PANEL
ALT: 33 U/L (ref 0–44)
AST: 27 U/L (ref 15–41)
Albumin: 3.6 g/dL (ref 3.5–5.0)
Alkaline Phosphatase: 94 U/L (ref 38–126)
Anion gap: 9 (ref 5–15)
BUN: 13 mg/dL (ref 6–20)
CO2: 25 mmol/L (ref 22–32)
Calcium: 9.2 mg/dL (ref 8.9–10.3)
Chloride: 102 mmol/L (ref 98–111)
Creatinine, Ser: 0.83 mg/dL (ref 0.44–1.00)
GFR calc Af Amer: >60 mL/min (ref >60)
GFR calc non Af Amer: >60 mL/min (ref >60)
Glucose, Bld: 165 mg/dL — ABNORMAL HIGH (ref 70–99)
Potassium: 3.8 mmol/L (ref 3.5–5.1)
Sodium: 136 mmol/L (ref 135–145)
Total Bilirubin: 1.9 mg/dL — ABNORMAL HIGH (ref 0.3–1.2)
Total Protein: 7.9 g/dL (ref 6.5–8.1)

## 2018-11-30 LAB — URINALYSIS, COMPLETE (UACMP) WITH MICROSCOPIC
Bacteria, UA: NONE SEEN
Bilirubin Urine: NEGATIVE
Glucose, UA: NEGATIVE mg/dL
Hgb urine dipstick: NEGATIVE
Ketones, ur: NEGATIVE mg/dL
Leukocytes,Ua: NEGATIVE
Nitrite: NEGATIVE
Protein, ur: NEGATIVE mg/dL
Specific Gravity, Urine: >1.046 — ABNORMAL HIGH (ref 1.005–1.030)
pH: 5 (ref 5.0–8.0)

## 2018-11-30 LAB — CBC
HCT: 41.1 % (ref 36.0–46.0)
Hemoglobin: 14.2 g/dL (ref 12.0–15.0)
MCH: 30.7 pg (ref 26.0–34.0)
MCHC: 34.5 g/dL (ref 30.0–36.0)
MCV: 89 fL (ref 80.0–100.0)
Platelets: 117 10*3/uL — ABNORMAL LOW (ref 150–400)
RBC: 4.62 MIL/uL (ref 3.87–5.11)
RDW: 12 % (ref 11.5–15.5)
WBC: 10.6 10*3/uL — ABNORMAL HIGH (ref 4.0–10.5)
nRBC: 0 % (ref 0.0–0.2)

## 2018-11-30 LAB — LIPASE, BLOOD: Lipase: 26 U/L (ref 11–51)

## 2018-11-30 MED ORDER — SODIUM CHLORIDE 0.9 % IV BOLUS
1000.0000 mL | Freq: Once | INTRAVENOUS | Status: AC
  Administered 2018-11-30: 1000 mL via INTRAVENOUS

## 2018-11-30 MED ORDER — NAPROXEN 375 MG PO TABS: 375.0000 mg | ORAL_TABLET | Freq: Two times a day (BID) | ORAL | 0 refills | Status: AC

## 2018-11-30 MED ORDER — PROMETHAZINE HCL 25 MG/ML IJ SOLN
12.5000 mg | Freq: Once | INTRAMUSCULAR | Status: AC
  Administered 2018-11-30: 12.5 mg via INTRAVENOUS
  Filled 2018-11-30: qty 1

## 2018-11-30 MED ORDER — IOHEXOL 300 MG/ML  SOLN
100.0000 mL | Freq: Once | INTRAMUSCULAR | Status: AC | PRN
  Administered 2018-11-30: 100 mL via INTRAVENOUS

## 2018-11-30 MED ORDER — HYDROMORPHONE HCL 1 MG/ML IJ SOLN
1.0000 mg | Freq: Once | INTRAMUSCULAR | Status: AC
  Administered 2018-11-30: 1 mg via INTRAVENOUS
  Filled 2018-11-30: qty 1

## 2018-11-30 MED ORDER — PROMETHAZINE HCL 12.5 MG PO TABS: 12.5000 mg | ORAL_TABLET | Freq: Three times a day (TID) | ORAL | 0 refills | Status: AC | PRN

## 2018-11-30 MED ORDER — KETOROLAC TROMETHAMINE 30 MG/ML IJ SOLN
15.0000 mg | Freq: Once | INTRAMUSCULAR | Status: AC
  Administered 2018-11-30: 15 mg via INTRAVENOUS
  Filled 2018-11-30: qty 1

## 2018-11-30 MED ORDER — ONDANSETRON HCL 4 MG/2ML IJ SOLN
4.0000 mg | Freq: Once | INTRAMUSCULAR | Status: AC
  Administered 2018-11-30: 4 mg via INTRAVENOUS
  Filled 2018-11-30: qty 2

## 2018-11-30 NOTE — ED Triage Notes (Signed)
Pt to ER with c/o RLQ abdominal pain that worsens with breathing or movement.  Pt reports pain started last night and is accompanied by nausea.  Pt denies vomiting or urinary symptoms.

## 2018-11-30 NOTE — Discharge Instructions (Addendum)
Your labs showed dehydration but were otherwise reassuring. No signs of infection, kidney injury, or other emergency. Take the meds as prescribed and drink plenty of fluids!

## 2018-11-30 NOTE — ED Provider Notes (Signed)
[facility] Emergency Department Provider Note  ____________________________________________   First MD Initiated Contact with Patient 11/30/18 1118     (approximate)  I have reviewed the triage vital signs and the nursing notes.   HISTORY  Chief Complaint Abdominal Pain    HPI Karina Cervantes is a 53 y.o. female  Here with abd pain. Pt reports gradual onset of progressively worsening severe Right sided abd pain. Began gradually several days ago and is now sever,e 10/10, diffuse. It is worse with palpation, eating, and movement. It is also somewhat positional. She denies any vomiting but has had nausea. She's had no change in Bm - no constipation or diarrhea. No urinary sx or vaginal bleeding or discharge. Pain worse with eating, no alleviating factors.       Past Medical History:  Diagnosis Date  . ADHD (attention deficit hyperactivity disorder)   . Anxiety   . Bipolar disorder ([place])   . Depression     Patient Active Problem List   Diagnosis Date Noted  . ASCUS with positive high risk HPV cervical 02/01/2018  . Benign essential HTN 02/01/2018  . Fatty liver 08/28/2017  . Risk for falls 11/03/2016  . 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 Hyperlipidemia 09/19/2016  . Diabetes mellitus ([place]) 09/19/2016  . Depression 09/19/2016  . Tobacco use disorder 07/20/2016  . CVA (cerebral vascular accident) ([place]) 07/19/2016  . 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 ADD (attention deficit disorder) 11/09/2014    Past Surgical History:  Procedure Laterality Date  . APPENDECTOMY      Prior to Admission medications   Medication Sig Start Date End Date Taking? Authorizing Provider  amLODipine (NORVASC) 5 MG tablet Take 5 mg by mouth daily. 09/14/16   [provider]  amphetamine-dextroamphetamine (ADDERALL XR) 30 MG 24 hr capsule Take 1 capsule (30 mg total) by mouth 2 (two) times daily. 05/29/18   Clapacs, Madie Reno, MD  amphetamine-dextroamphetamine  (ADDERALL XR) 30 MG 24 hr capsule Take 1 capsule (30 mg total) by mouth 2 (two) times daily. 11/14/18   Clapacs, Madie Reno, MD  amphetamine-dextroamphetamine (ADDERALL XR) 30 MG 24 hr capsule Take 1 capsule (30 mg total) by mouth 2 (two) times daily. 11/14/18   Clapacs, Madie Reno, MD  amphetamine-dextroamphetamine (ADDERALL XR) 30 MG 24 hr capsule Take 1 capsule (30 mg total) by mouth 2 (two) times daily. 11/14/18   Clapacs, Madie Reno, MD  aspirin 81 MG chewable tablet Chew 81 mg by mouth daily.  12/25/16   [provider]  atorvastatin (LIPITOR) 80 MG tablet Take 80 mg by mouth daily. 09/14/16   [provider]  Blood Glucose Monitoring Suppl (ONE TOUCH ULTRA MINI) w/Device KIT 2 (two) times daily. for testing 08/30/16   [provider]  hydrochlorothiazide (HYDRODIURIL) 50 MG tablet Take 50 mg by mouth daily. 08/25/16   [provider]  lisinopril (PRINIVIL,ZESTRIL) 20 MG tablet Take 20 mg by mouth daily. 09/14/16   [provider]  metFORMIN (GLUCOPHAGE) 500 MG tablet Take 500 mg by mouth. 09/14/16 10/14/16  [provider]  naproxen (NAPROSYN) 375 MG tablet Take 1 tablet (375 mg total) by mouth 2 (two) times daily with a meal for 7 days. 11/30/18 12/07/18  Duffy Bruce, MD  ONE TOUCH ULTRA TEST test strip 2 (two) times daily. for testing 08/30/16   [provider]  [facility] DELICA LANCETS 94R MISC 2 (two) times daily. for testing 08/30/16   [provider]  promethazine (PHENERGAN) 12.5  MG tablet Take 1 tablet (12.5 mg total) by mouth every 8 (eight) hours as needed for nausea or vomiting. 11/30/18   Duffy Bruce, MD    Allergies Penicillins and Latex  Family History  Problem Relation Age of Onset  . Stroke Mother   . Hypertension Mother   . Anxiety disorder Mother   . Depression Mother   . Heart attack Father   . Hypertension Father   . Bipolar disorder Sister   . Heart attack Brother   . Alcohol abuse Brother   . Drug abuse Brother    . Heart attack Brother   . Alcohol abuse Brother   . Drug abuse Brother     Social History Social History   Tobacco Use  . Smoking status: Former Smoker    Packs/day: 0.50    Types: Cigarettes    Start date: 11/09/1994    Quit date: 07/09/2016    Years since quitting: 2.3  . Smokeless tobacco: Never Used  Substance Use Topics  . Alcohol use: No    Alcohol/week: 0.0 standard drinks  . Drug use: No    Review of Systems  Review of Systems  Constitutional: Positive for fatigue. Negative for fever.  HENT: Negative for congestion and sore throat.   Eyes: Negative for visual disturbance.  Respiratory: Negative for cough and shortness of breath.   Cardiovascular: Negative for chest pain.  Gastrointestinal: Positive for abdominal pain. Negative for diarrhea, nausea and vomiting.  Genitourinary: Negative for flank pain.  Musculoskeletal: Negative for back pain and neck pain.  Skin: Negative for rash and wound.  Neurological: Negative for weakness.  All other systems reviewed and are negative.    ____________________________________________  PHYSICAL EXAM:      VITAL SIGNS: ED Triage Vitals  Enc Vitals Group     BP 11/30/18 1053 (!) 187/98     Pulse Rate 11/30/18 1052 75     Resp 11/30/18 1052 (!) 22     Temp 11/30/18 1052 98.1 F (36.7 C)     Temp src --      SpO2 11/30/18 1052 98 %     Weight 11/30/18 1053 171 lb 15.3 oz (78 kg)     Height 11/30/18 1053 '5\' 3"'  (1.6 m)     Head Circumference --      Peak Flow --      Pain Score 11/30/18 1053 8     Pain Loc --      Pain Edu? --      Excl. in [place]? --      Physical Exam Vitals signs and nursing note reviewed.  Constitutional:      General: She is not in acute distress.    Appearance: She is well-developed.  HENT:     Head: Normocephalic and atraumatic.  Eyes:     Conjunctiva/sclera: Conjunctivae normal.  Neck:     Musculoskeletal: Neck supple.  Cardiovascular:     Rate and Rhythm: Normal rate and regular  rhythm.     Heart sounds: Normal heart sounds. No murmur. No friction rub.  Pulmonary:     Effort: Pulmonary effort is normal. No respiratory distress.     Breath sounds: Normal breath sounds. No wheezing or rales.  Abdominal:     General: Bowel sounds are normal. There is no distension.     Palpations: Abdomen is soft.     Tenderness: There is abdominal tenderness in the right upper quadrant, right lower quadrant and periumbilical area. There is no guarding  or rebound.  Skin:    General: Skin is warm.     Capillary Refill: Capillary refill takes less than 2 seconds.  Neurological:     Mental Status: She is alert and oriented to person, place, and time.     Motor: No abnormal muscle tone.       ____________________________________________   LABS (all labs ordered are listed, but only abnormal results are displayed)  Labs Reviewed  CBC - Abnormal; Notable for the following components:      Result Value   WBC 10.6 (*)    Platelets 117 (*)    All other components within normal limits  URINALYSIS, COMPLETE (UACMP) WITH MICROSCOPIC - Abnormal; Notable for the following components:   Color, Urine YELLOW (*)    APPearance HAZY (*)    Specific Gravity, Urine >1.046 (*)    All other components within normal limits  COMPREHENSIVE METABOLIC PANEL - Abnormal; Notable for the following components:   Glucose, Bld 165 (*)    Total Bilirubin 1.9 (*)    All other components within normal limits  LIPASE, BLOOD     ________________________________________  RADIOLOGY All imaging, including plain films, CT scans, and ultrasounds, independently reviewed by me, and interpretations confirmed via formal radiology reads.  ED MD interpretation:   CT A/P: No acute abnormality  Official radiology report(s): Ct Abdomen Pelvis W Contrast  Result Date: 11/30/2018 CLINICAL DATA:  Right lower quadrant abdominal pain, nausea EXAM: CT ABDOMEN AND PELVIS WITH CONTRAST TECHNIQUE: Multidetector CT  imaging of the abdomen and pelvis was performed using the standard protocol following bolus administration of intravenous contrast. CONTRAST:  142m OMNIPAQUE IOHEXOL 300 MG/ML  SOLN COMPARISON:  None. FINDINGS: Lower chest: Lung bases are clear. Hepatobiliary: Macronodular cirrhosis. No focal hepatic lesion is seen. Status post cholecystectomy. No intrahepatic ductal dilatation. Common duct measures 9 mm distally but smoothly tapers at the ampulla, likely postsurgical. Pancreas: Within normal limits. Spleen: At the upper limits of normal for size. Adrenals/Urinary Tract: Adrenal glands are within normal limits. Kidneys are within normal limits.  No hydronephrosis. Bladder is mildly thick-walled although underdistended. Stomach/Bowel: Stomach within normal limits. No evidence of bowel obstruction. Normal appendix (coronal image 44). No colonic wall thickening or inflammatory changes. Vascular/Lymphatic: No evidence of abdominal aortic aneurysm. Atherosclerotic calcifications of the abdominal aorta and branch vessels. Portal vein is patent. No suspicious abdominopelvic lymphadenopathy. Reproductive: Uterus is within normal limits. Bilateral ovaries are within normal limits. Other: No abdominopelvic ascites. Musculoskeletal: Very mild degenerative of the lumbar spine. IMPRESSION: No evidence of bowel obstruction.  Normal appendix. Status post cholecystectomy. Cirrhosis. Portal vein is patent. Spleen is at the upper limits of normal for size. No abdominopelvic ascites. No CT findings to account for the patient's right lower quadrant abdominal pain. Electronically Signed   By: SJulian HyM.D.   On: 11/30/2018 13:32    ____________________________________________  PROCEDURES   Procedure(s) performed (including Critical Care):  Procedures  ____________________________________________  INITIAL IMPRESSION / MDM / [place]/ ED COURSE  As part of my medical decision making, I reviewed the  following data within the [place] from prior ED visits and [place] Emergency Department on 11/30/2018 for the symptoms described in the history of present illness. She was evaluated in the context of the global COVID-19 pandemic, which necessitated consideration that the patient might be at risk for infection with the SARS-CoV-2 virus  that causes COVID-19. Institutional protocols and algorithms that pertain to the evaluation of patients at risk for COVID-19 are in a state of rapid change based on information released by regulatory bodies including the CDC and federal and state organizations. These policies and algorithms were followed during the patient's care in the ED.  Some ED evaluations and interventions may be delayed as a result of limited staffing during the pandemic.*      Medical Decision Making: 53 yo F here with severe R sided abd pain. Lab work is overall very reassuring. Mild reactive leukocytosis noted but LFTs, lipase normal. CT is neg for appendicitis or other acute abnormality. She is s/p hysterectomy, denies any VB, discharge, or signs to suggest GU etiology. Pain improved with symptomatic control in ED. Suspect viral Gi illness, IBS, less likely abd wall pain. No apparent emergent etiology at this time. Will tx supportively and give very good return precautions. Encouraged fluids due to mild dehydration noted on UA. ____________________________________________  FINAL CLINICAL IMPRESSION(S) / ED DIAGNOSES  Final diagnoses:  Right lower quadrant abdominal pain     MEDICATIONS GIVEN DURING THIS VISIT:  Medications  HYDROmorphone (DILAUDID) injection 1 mg (1 mg Intravenous Given 11/30/18 1204)  ondansetron (ZOFRAN) injection 4 mg (4 mg Intravenous Given 11/30/18 1201)  sodium chloride 0.9 % bolus 1,000 mL (0 mLs Intravenous Stopped 11/30/18 1315)  ketorolac (TORADOL) 30 MG/ML injection 15 mg  (15 mg Intravenous Given 11/30/18 1202)  iohexol (OMNIPAQUE) 300 MG/ML solution 100 mL (100 mLs Intravenous Contrast Given 11/30/18 1311)  promethazine (PHENERGAN) injection 12.5 mg (12.5 mg Intravenous Given 11/30/18 1419)     ED Discharge Orders         Ordered    promethazine (PHENERGAN) 12.5 MG tablet  Every 8 hours PRN     11/30/18 1516    naproxen (NAPROSYN) 375 MG tablet  2 times daily with meals     11/30/18 1516           Note:  This document was prepared using Dragon voice recognition software and may include unintentional dictation errors.   Duffy Bruce, MD 11/30/18 2325

## 2018-11-30 NOTE — ED Notes (Signed)
Pt's hx states previous appendectomy, surgical scars show for chole, pt is a poor historian

## 2018-11-30 NOTE — ED Notes (Signed)
Pt rolling around on bed stating in severe pain. Pt hard to understand, mumbling. Husband at bedside.

## 2018-11-30 NOTE — ED Notes (Signed)
Pt's bed cleaned of incontinent urine

## 2019-02-10 ENCOUNTER — Other Ambulatory Visit: Payer: Self-pay | Admitting: Psychiatry

## 2019-02-10 DIAGNOSIS — F901 Attention-deficit hyperactivity disorder, predominantly hyperactive type: Secondary | ICD-10-CM

## 2019-02-12 ENCOUNTER — Other Ambulatory Visit: Payer: Self-pay | Admitting: Psychiatry

## 2019-02-12 DIAGNOSIS — F901 Attention-deficit hyperactivity disorder, predominantly hyperactive type: Secondary | ICD-10-CM

## 2019-02-12 MED ORDER — AMPHETAMINE-DEXTROAMPHET ER 30 MG PO CP24: 30.0000 mg | ORAL_CAPSULE | Freq: Two times a day (BID) | ORAL | 0 refills | Status: DC

## 2019-02-13 ENCOUNTER — Ambulatory Visit (INDEPENDENT_AMBULATORY_CARE_PROVIDER_SITE_OTHER): Payer: Medicare Other | Admitting: Psychiatry

## 2019-02-13 ENCOUNTER — Other Ambulatory Visit: Payer: Self-pay

## 2019-02-13 ENCOUNTER — Encounter: Payer: Self-pay | Admitting: Psychiatry

## 2019-02-13 DIAGNOSIS — F901 Attention-deficit hyperactivity disorder, predominantly hyperactive type: Secondary | ICD-10-CM

## 2019-02-13 NOTE — Progress Notes (Signed)
Follow-up for this woman with history of ADHD.  Reached her on the telephone.  Patient was appropriate and interactive.  Denied any new symptoms or complaints.  Stated her mood is been pretty stable.  We talked about the stress of the coronavirus.  She is still taking her ADHD medicine and denies any side effects.  Sleeping adequately.  No psychotic symptoms no anger.  Feels like her focus and attention are better.  Alert and oriented.  Appropriate affect and interaction no sign of delusional thinking.  No sign of dangerousness.  Reviewed medication reviewed side effects.  Refill medication follow-up in 3 months.

## 2019-03-31 ENCOUNTER — Other Ambulatory Visit: Payer: Self-pay

## 2019-03-31 ENCOUNTER — Emergency Department
Admission: EM | Admit: 2019-03-31 | Discharge: 2019-03-31 | Disposition: A | Discharge status: Home / Self Care | Payer: Medicare Other | Attending: Emergency Medicine | Admitting: Emergency Medicine

## 2019-03-31 ENCOUNTER — Emergency Department: Payer: Medicare Other

## 2019-03-31 DIAGNOSIS — R05 Cough: Secondary | ICD-10-CM | POA: Diagnosis not present

## 2019-03-31 DIAGNOSIS — Z79899 Other long term (current) drug therapy: Secondary | ICD-10-CM | POA: Diagnosis not present

## 2019-03-31 DIAGNOSIS — Z20822 Contact with and (suspected) exposure to covid-19: Secondary | ICD-10-CM

## 2019-03-31 DIAGNOSIS — B9789 Other viral agents as the cause of diseases classified elsewhere: Secondary | ICD-10-CM | POA: Diagnosis not present

## 2019-03-31 DIAGNOSIS — Z7982 Long term (current) use of aspirin: Secondary | ICD-10-CM | POA: Diagnosis not present

## 2019-03-31 DIAGNOSIS — Z87891 Personal history of nicotine dependence: Secondary | ICD-10-CM | POA: Insufficient documentation

## 2019-03-31 DIAGNOSIS — Z8673 Personal history of transient ischemic attack (TIA), and cerebral infarction without residual deficits: Secondary | ICD-10-CM | POA: Insufficient documentation

## 2019-03-31 DIAGNOSIS — I10 Essential (primary) hypertension: Secondary | ICD-10-CM | POA: Diagnosis not present

## 2019-03-31 DIAGNOSIS — J069 Acute upper respiratory infection, unspecified: Secondary | ICD-10-CM | POA: Diagnosis not present

## 2019-03-31 DIAGNOSIS — U071 COVID-19: Secondary | ICD-10-CM | POA: Diagnosis not present

## 2019-03-31 DIAGNOSIS — Z91040 Latex allergy status: Secondary | ICD-10-CM | POA: Insufficient documentation

## 2019-03-31 DIAGNOSIS — R059 Cough, unspecified: Secondary | ICD-10-CM

## 2019-03-31 NOTE — ED Provider Notes (Signed)
[facility] Emergency Department Provider Note   ____________________________________________   First MD Initiated Contact with Patient 03/31/19 [phone]     (approximate)  I have reviewed the triage vital signs and the nursing notes.   HISTORY  Chief Complaint URI    HPI Karina Cervantes is a 53 y.o. female with past medical history of bipolar disorder, hypertension, diabetes who presents to the ED complaining of cough and congestion.  Patient reports approximately 1 week of nonproductive cough as well as nasal congestion and sinus pressure.  She states she has had subjective fevers and chills, but has not taken her temperature at home.  She denies any chest pain or shortness of breath.  She is not aware of any sick contacts but states she is concerned she could have Covid.        Past Medical History:  Diagnosis Date  . ADHD (attention deficit hyperactivity disorder)   . Anxiety   . Bipolar disorder ([place])   . Depression     Patient Active Problem List   Diagnosis Date Noted  . ASCUS with positive high risk HPV cervical 02/01/2018  . Benign essential HTN 02/01/2018  . Fatty liver 08/28/2017  . Risk for falls 11/03/2016  . 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 Hyperlipidemia 09/19/2016  . Diabetes mellitus ([place]) 09/19/2016  . Depression 09/19/2016  . Tobacco use disorder 07/20/2016  . CVA (cerebral vascular accident) ([place]) 07/19/2016  . 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 ADD (attention deficit disorder) 11/09/2014    Past Surgical History:  Procedure Laterality Date  . APPENDECTOMY      Prior to Admission medications   Medication Sig Start Date End Date Taking? Authorizing Provider  amLODipine (NORVASC) 5 MG tablet Take 5 mg by mouth daily. 09/14/16   [provider]  amphetamine-dextroamphetamine (ADDERALL XR) 30 MG 24 hr capsule Take 1 capsule (30 mg total) by mouth 2 (two) times daily. 05/29/18   Clapacs, Madie Reno, MD   amphetamine-dextroamphetamine (ADDERALL XR) 30 MG 24 hr capsule Take 1 capsule (30 mg total) by mouth 2 (two) times daily. 02/12/19   Clapacs, Madie Reno, MD  amphetamine-dextroamphetamine (ADDERALL XR) 30 MG 24 hr capsule Take 1 capsule (30 mg total) by mouth 2 (two) times daily. 02/12/19   Clapacs, Madie Reno, MD  amphetamine-dextroamphetamine (ADDERALL XR) 30 MG 24 hr capsule Take 1 capsule (30 mg total) by mouth 2 (two) times daily. 02/12/19   Clapacs, Madie Reno, MD  aspirin 81 MG chewable tablet Chew 81 mg by mouth daily.  12/25/16   [provider]  atorvastatin (LIPITOR) 80 MG tablet Take 80 mg by mouth daily. 09/14/16   [provider]  Blood Glucose Monitoring Suppl (ONE TOUCH ULTRA MINI) w/Device KIT 2 (two) times daily. for testing 08/30/16   [provider]  hydrochlorothiazide (HYDRODIURIL) 50 MG tablet Take 50 mg by mouth daily. 08/25/16   [provider]  lisinopril (PRINIVIL,ZESTRIL) 20 MG tablet Take 20 mg by mouth daily. 09/14/16   [provider]  metFORMIN (GLUCOPHAGE) 500 MG tablet Take 500 mg by mouth. 09/14/16 10/14/16  [provider]  ONE TOUCH ULTRA TEST test strip 2 (two) times daily. for testing 08/30/16   [provider]  [facility] DELICA LANCETS 98P MISC 2 (two) times daily. for testing 08/30/16   [provider]  promethazine (PHENERGAN) 12.5 MG tablet Take 1 tablet (12.5 mg total) by mouth every 8 (eight) hours as needed for nausea or vomiting. 11/30/18  Duffy Bruce, MD    Allergies Penicillins and Latex  Family History  Problem Relation Age of Onset  . Stroke Mother   . Hypertension Mother   . Anxiety disorder Mother   . Depression Mother   . Heart attack Father   . Hypertension Father   . Bipolar disorder Sister   . Heart attack Brother   . Alcohol abuse Brother   . Drug abuse Brother   . Heart attack Brother   . Alcohol abuse Brother   . Drug abuse Brother     Social History Social History    Tobacco Use  . Smoking status: Former Smoker    Packs/day: 0.50    Types: Cigarettes    Start date: 11/09/1994    Quit date: 07/09/2016    Years since quitting: 2.7  . Smokeless tobacco: Never Used  Substance Use Topics  . Alcohol use: No    Alcohol/week: 0.0 standard drinks  . Drug use: No    Review of Systems  Constitutional: No fever/chills Eyes: No visual changes. ENT: No sore throat.  Positive for nasal congestion. Cardiovascular: Denies chest pain. Respiratory: Denies shortness of breath.  Positive for cough. Gastrointestinal: No abdominal pain.  No nausea, no vomiting.  No diarrhea.  No constipation. Genitourinary: Negative for dysuria. Musculoskeletal: Negative for back pain. Skin: Negative for rash. Neurological: Negative for headaches, focal weakness or numbness.  ____________________________________________   PHYSICAL EXAM:  VITAL SIGNS: ED Triage Vitals  Enc Vitals Group     BP 03/31/19 0304 (!) 178/92     Pulse Rate 03/31/19 0304 78     Resp 03/31/19 0304 18     Temp 03/31/19 0304 98.5 F (36.9 C)     Temp Source 03/31/19 0304 Oral     SpO2 03/31/19 0304 98 %     Weight 03/31/19 0303 170 lb (77.1 kg)     Height 03/31/19 0303 '5\' 2"'  (1.575 m)     Head Circumference --      Peak Flow --      Pain Score 03/31/19 0303 0     Pain Loc --      Pain Edu? --      Excl. in [place]? --     Constitutional: Alert and oriented. Eyes: Conjunctivae are normal. Head: Atraumatic. Nose: No congestion/rhinnorhea. Mouth/Throat: Mucous membranes are moist. Neck: Normal ROM Cardiovascular: Normal rate, regular rhythm. Grossly normal heart sounds. Respiratory: Normal respiratory effort.  No retractions. Lungs CTAB. Gastrointestinal: Soft and nontender. No distention. Genitourinary: deferred Musculoskeletal: No lower extremity tenderness nor edema. Neurologic:  Normal speech and language. No gross focal neurologic deficits are appreciated. Skin:  Skin is warm, dry and  intact. No rash noted. Psychiatric: Mood and affect are normal. Speech and behavior are normal.  ____________________________________________   LABS (all labs ordered are listed, but only abnormal results are displayed)  Labs Reviewed  NOVEL CORONAVIRUS, NAA (HOSP ORDER, SEND-OUT TO REF LAB; TAT 18-24 HRS)     PROCEDURES  Procedure(s) performed (including Critical Care):  Procedures   ____________________________________________   INITIAL IMPRESSION / ASSESSMENT AND PLAN / ED COURSE       53 year old female presents to the ED with 1 week of cough and nasal congestion, denies any chest pain or shortness of breath.  She is overall well-appearing and in no respiratory distress.  We will check chest x-ray and perform COVID-19 testing.  I have counseled her on the use of over-the-counter medications for nasal congestion including Flonase and Zyrtec.  Chest x-ray negative, counseled patient on quarantine process until COVID-19 results are known.  Patient agrees with plan.      ____________________________________________   FINAL CLINICAL IMPRESSION(S) / ED DIAGNOSES  Final diagnoses:  Viral upper respiratory tract infection  Cough  Suspected COVID-19 virus infection     ED Discharge Orders    None       Note:  This document was prepared using Dragon voice recognition software and may include unintentional dictation errors.   Blake Divine, MD 03/31/19 [phone]

## 2019-03-31 NOTE — ED Triage Notes (Signed)
Patient reports cold symptoms for approximately a week.

## 2019-03-31 NOTE — ED Notes (Signed)
Pt presents with rapid speech. Pt appears anxious and reports "very anxious."

## 2019-03-31 NOTE — ED Notes (Signed)
Pt reports nasal congestion 

## 2019-04-01 LAB — NOVEL CORONAVIRUS, NAA (HOSP ORDER, SEND-OUT TO REF LAB; TAT 18-24 HRS): SARS-CoV-2, NAA: DETECTED — AB

## 2019-04-02 ENCOUNTER — Telehealth: Payer: Self-pay | Admitting: Emergency Medicine

## 2019-04-02 NOTE — Telephone Encounter (Signed)
Called patient to inform of positive covid 19 result.  Left message.

## 2019-04-09 DIAGNOSIS — E119 Type 2 diabetes mellitus without complications: Secondary | ICD-10-CM | POA: Diagnosis not present

## 2019-04-09 DIAGNOSIS — Z7984 Long term (current) use of oral hypoglycemic drugs: Secondary | ICD-10-CM | POA: Diagnosis not present

## 2019-04-09 DIAGNOSIS — Z79899 Other long term (current) drug therapy: Secondary | ICD-10-CM | POA: Diagnosis not present

## 2019-04-09 DIAGNOSIS — K760 Fatty (change of) liver, not elsewhere classified: Secondary | ICD-10-CM | POA: Diagnosis not present

## 2019-04-09 DIAGNOSIS — Z91040 Latex allergy status: Secondary | ICD-10-CM | POA: Diagnosis not present

## 2019-04-09 DIAGNOSIS — Z7982 Long term (current) use of aspirin: Secondary | ICD-10-CM | POA: Diagnosis not present

## 2019-04-09 DIAGNOSIS — M25521 Pain in right elbow: Secondary | ICD-10-CM | POA: Diagnosis not present

## 2019-04-09 DIAGNOSIS — R5383 Other fatigue: Secondary | ICD-10-CM | POA: Diagnosis not present

## 2019-04-09 DIAGNOSIS — Z8673 Personal history of transient ischemic attack (TIA), and cerebral infarction without residual deficits: Secondary | ICD-10-CM | POA: Diagnosis not present

## 2019-04-09 DIAGNOSIS — S80211A Abrasion, right knee, initial encounter: Secondary | ICD-10-CM | POA: Diagnosis not present

## 2019-04-09 DIAGNOSIS — Z87891 Personal history of nicotine dependence: Secondary | ICD-10-CM | POA: Diagnosis not present

## 2019-04-09 DIAGNOSIS — E785 Hyperlipidemia, unspecified: Secondary | ICD-10-CM | POA: Diagnosis not present

## 2019-04-09 DIAGNOSIS — M19029 Primary osteoarthritis, unspecified elbow: Secondary | ICD-10-CM | POA: Diagnosis not present

## 2019-04-09 DIAGNOSIS — Z880 Allergy status to penicillin: Secondary | ICD-10-CM | POA: Diagnosis not present

## 2019-04-09 DIAGNOSIS — I10 Essential (primary) hypertension: Secondary | ICD-10-CM | POA: Diagnosis not present

## 2019-04-09 DIAGNOSIS — M25511 Pain in right shoulder: Secondary | ICD-10-CM | POA: Diagnosis not present

## 2019-05-08 ENCOUNTER — Ambulatory Visit (INDEPENDENT_AMBULATORY_CARE_PROVIDER_SITE_OTHER): Payer: Medicare Other | Admitting: Psychiatry

## 2019-05-08 ENCOUNTER — Other Ambulatory Visit: Payer: Self-pay

## 2019-05-08 ENCOUNTER — Encounter: Payer: Self-pay | Admitting: Psychiatry

## 2019-05-08 DIAGNOSIS — F901 Attention-deficit hyperactivity disorder, predominantly hyperactive type: Secondary | ICD-10-CM

## 2019-05-08 DIAGNOSIS — F411 Generalized anxiety disorder: Secondary | ICD-10-CM

## 2019-05-08 MED ORDER — AMPHETAMINE-DEXTROAMPHET ER 30 MG PO CP24: 30.0000 mg | ORAL_CAPSULE | Freq: Two times a day (BID) | ORAL | 0 refills | Status: DC

## 2019-05-08 NOTE — Progress Notes (Signed)
Follow-up with this patient with ADHD and anxiety.  Reach patient by telephone.  Patient was on time and appropriate.  Reported that mood has been good.  She did have a family loss in the last day when her brother-in-law died.  Also she had coronavirus back at the end of December however she is recovered from that.  She denies being depressed currently.  Denies any suicidal thought.  As usual she is a little scattered in her thinking but not psychotic not bizarre.  States that she continues to use medicine appropriately.  Alert and oriented.  Appropriate interaction.  No evidence psychosis.  Continue current medication.  Reauthorize for 3 months.  Follow-up at that time.  Supportive therapy and review of medicine.

## 2019-05-24 DIAGNOSIS — F329 Major depressive disorder, single episode, unspecified: Secondary | ICD-10-CM | POA: Diagnosis not present

## 2019-05-24 DIAGNOSIS — M7989 Other specified soft tissue disorders: Secondary | ICD-10-CM | POA: Diagnosis not present

## 2019-05-24 DIAGNOSIS — Z7984 Long term (current) use of oral hypoglycemic drugs: Secondary | ICD-10-CM | POA: Diagnosis not present

## 2019-05-24 DIAGNOSIS — Z880 Allergy status to penicillin: Secondary | ICD-10-CM | POA: Diagnosis not present

## 2019-05-24 DIAGNOSIS — Z7982 Long term (current) use of aspirin: Secondary | ICD-10-CM | POA: Diagnosis not present

## 2019-05-24 DIAGNOSIS — M25571 Pain in right ankle and joints of right foot: Secondary | ICD-10-CM | POA: Diagnosis not present

## 2019-05-24 DIAGNOSIS — I10 Essential (primary) hypertension: Secondary | ICD-10-CM | POA: Diagnosis not present

## 2019-05-24 DIAGNOSIS — J45909 Unspecified asthma, uncomplicated: Secondary | ICD-10-CM | POA: Diagnosis not present

## 2019-05-24 DIAGNOSIS — Z87891 Personal history of nicotine dependence: Secondary | ICD-10-CM | POA: Diagnosis not present

## 2019-05-24 DIAGNOSIS — Z9049 Acquired absence of other specified parts of digestive tract: Secondary | ICD-10-CM | POA: Diagnosis not present

## 2019-05-24 DIAGNOSIS — Z91040 Latex allergy status: Secondary | ICD-10-CM | POA: Diagnosis not present

## 2019-05-24 DIAGNOSIS — Z79899 Other long term (current) drug therapy: Secondary | ICD-10-CM | POA: Diagnosis not present

## 2019-05-24 DIAGNOSIS — R202 Paresthesia of skin: Secondary | ICD-10-CM | POA: Diagnosis not present

## 2019-05-24 DIAGNOSIS — Z8673 Personal history of transient ischemic attack (TIA), and cerebral infarction without residual deficits: Secondary | ICD-10-CM | POA: Diagnosis not present

## 2019-05-24 DIAGNOSIS — S93401A Sprain of unspecified ligament of right ankle, initial encounter: Secondary | ICD-10-CM | POA: Diagnosis not present

## 2019-05-24 DIAGNOSIS — F900 Attention-deficit hyperactivity disorder, predominantly inattentive type: Secondary | ICD-10-CM | POA: Diagnosis not present

## 2019-05-24 DIAGNOSIS — E785 Hyperlipidemia, unspecified: Secondary | ICD-10-CM | POA: Diagnosis not present

## 2019-05-24 DIAGNOSIS — E119 Type 2 diabetes mellitus without complications: Secondary | ICD-10-CM | POA: Diagnosis not present

## 2019-07-31 ENCOUNTER — Telehealth: Payer: Self-pay

## 2019-07-31 NOTE — Telephone Encounter (Signed)
Patient called requesting a refill on her Adderall XR 30mg  to be sent to [facility] on [address] in [place]. Thank you.

## 2019-08-05 ENCOUNTER — Other Ambulatory Visit: Payer: Self-pay | Admitting: Psychiatry

## 2019-08-05 MED ORDER — AMPHETAMINE-DEXTROAMPHET ER 30 MG PO CP24: 30.0000 mg | ORAL_CAPSULE | Freq: Two times a day (BID) | ORAL | 0 refills | Status: DC

## 2019-08-07 ENCOUNTER — Encounter: Payer: Self-pay | Admitting: Psychiatry

## 2019-08-07 ENCOUNTER — Telehealth (INDEPENDENT_AMBULATORY_CARE_PROVIDER_SITE_OTHER): Payer: Medicare Other | Admitting: Psychiatry

## 2019-08-07 ENCOUNTER — Other Ambulatory Visit: Payer: Self-pay

## 2019-08-07 DIAGNOSIS — F901 Attention-deficit hyperactivity disorder, predominantly hyperactive type: Secondary | ICD-10-CM

## 2019-08-07 NOTE — Progress Notes (Signed)
Follow-up for this patient with ADHD.  Reach patient by telephone.  Confirmed her identity in my own.  Patient had no new complaints.  As usual says that she has good days and bad days.  Stresses from the family.  Denies any new medical problems.  Denies any suicidal or homicidal thought or psychotic symptoms.  Claims to be sleeping well.  Claims to be fully compliant with medication.  She is slurring her speech a little more than usual.  I inquired whether she noticed it or could have had another stroke but she does not seem to think it is a problem.  Mood seems to be stable affect stable no evidence of psychosis.  Reviewed medication.  Everything had actually been refilled 3 days ago so we will not need to do that today and we will follow-up in 3 months.

## 2019-10-29 ENCOUNTER — Telehealth: Payer: Self-pay

## 2019-10-29 MED ORDER — AMPHETAMINE-DEXTROAMPHET ER 30 MG PO CP24: 30.0000 mg | ORAL_CAPSULE | Freq: Two times a day (BID) | ORAL | 0 refills | Status: DC

## 2019-10-29 NOTE — Telephone Encounter (Signed)
[place] PMP checked. No abuse/misuse noted. Rx sent to pt's pharmacy for 15 days supply

## 2019-10-29 NOTE — Telephone Encounter (Signed)
pt called states she will need refills on her medications that she will run out end of this week. pt was made and appt to see dr. Toni Amend.

## 2019-10-30 ENCOUNTER — Telehealth: Payer: Self-pay

## 2019-10-30 MED ORDER — AMPHETAMINE-DEXTROAMPHET ER 30 MG PO CP24: 30.0000 mg | ORAL_CAPSULE | Freq: Two times a day (BID) | ORAL | 0 refills | Status: DC

## 2019-10-30 NOTE — Telephone Encounter (Signed)
[facility] pharmacy called left message that pt was only given a 15 day supply can they please get a new rx for a 30 day supply

## 2019-10-30 NOTE — Telephone Encounter (Signed)
Ok sent!

## 2019-11-18 ENCOUNTER — Encounter: Payer: Self-pay | Admitting: Psychiatry

## 2019-11-18 ENCOUNTER — Other Ambulatory Visit: Payer: Self-pay

## 2019-11-18 ENCOUNTER — Telehealth (INDEPENDENT_AMBULATORY_CARE_PROVIDER_SITE_OTHER): Payer: Medicare Other | Admitting: Psychiatry

## 2019-11-18 DIAGNOSIS — F901 Attention-deficit hyperactivity disorder, predominantly hyperactive type: Secondary | ICD-10-CM

## 2019-11-18 MED ORDER — AMPHETAMINE-DEXTROAMPHET ER 30 MG PO CP24: 30.0000 mg | ORAL_CAPSULE | Freq: Two times a day (BID) | ORAL | 0 refills | Status: DC

## 2019-11-18 NOTE — Progress Notes (Signed)
Reach patient by telephone.  Confirmed her identity and my own.  Total time on the telephone 10 minutes.  Patient has no new complaints.  Mood and anxiety stable.  Attention under good control.  Sleeping well.  Eating normally.  No major mood problems.  Alert and oriented.  Appropriate affect and interaction.  Thoughts lucid.  Denies suicidal thoughts.  No evidence of delusions.  Supportive therapy and review of medication plan.  Review filled medicine for 3 months.  Follow-up at that point.

## 2020-02-23 ENCOUNTER — Other Ambulatory Visit: Payer: Self-pay | Admitting: Psychiatry

## 2020-02-23 DIAGNOSIS — F901 Attention-deficit hyperactivity disorder, predominantly hyperactive type: Secondary | ICD-10-CM

## 2020-02-23 MED ORDER — AMPHETAMINE-DEXTROAMPHET ER 30 MG PO CP24: 30.0000 mg | ORAL_CAPSULE | Freq: Two times a day (BID) | ORAL | 0 refills | Status: DC

## 2020-02-24 ENCOUNTER — Other Ambulatory Visit: Payer: Self-pay

## 2020-02-24 ENCOUNTER — Encounter: Payer: Self-pay | Admitting: Psychiatry

## 2020-02-24 ENCOUNTER — Telehealth (INDEPENDENT_AMBULATORY_CARE_PROVIDER_SITE_OTHER): Payer: Medicare Other | Admitting: Psychiatry

## 2020-02-24 DIAGNOSIS — F411 Generalized anxiety disorder: Secondary | ICD-10-CM | POA: Diagnosis not present

## 2020-02-24 DIAGNOSIS — F901 Attention-deficit hyperactivity disorder, predominantly hyperactive type: Secondary | ICD-10-CM | POA: Diagnosis not present

## 2020-02-24 NOTE — Progress Notes (Signed)
Virtual Visit via Telephone Note  I connected with Karina Cervantes on 02/24/20 at  1:00 PM EST by telephone and verified that I am speaking with the correct person using two identifiers.  Location: Patient: Home Provider: Hospital   I discussed the limitations, risks, security and privacy concerns of performing an evaluation and management service by telephone and the availability of in person appointments. I also discussed with the patient that there may be a patient responsible charge related to this service. The patient expressed understanding and agreed to proceed.   History of Present Illness: Patient reached by telephone.  No new complaints.  Chronic life anxieties.  Mild ups and downs emotionally.  Focus adequate.  Tolerating medicine well.  No sign of any psychosis.  No sign of any dangerousness    Observations/Objective: Alert and oriented.  Answered phone on time.  Interactive appropriately.  Lucid thinking.  Voice slurred as usual and somewhat difficult to understand but did not appear psychotic   Assessment and Plan: Stable tolerating current medicine.  Reviewed side effects and medication.  Everything refilled follow-up 3 months   Follow Up Instructions:    I discussed the assessment and treatment plan with the patient. The patient was provided an opportunity to ask questions and all were answered. The patient agreed with the plan and demonstrated an understanding of the instructions.   The patient was advised to call back or seek an in-person evaluation if the symptoms worsen or if the condition fails to improve as anticipated.  I provided of non-face-to-face time during this encounter.   Mordecai Rasmussen, MD

## 2020-04-19 ENCOUNTER — Telehealth: Payer: Self-pay

## 2020-04-19 NOTE — Telephone Encounter (Signed)
Medication problem - Telephone call from pt requesting a new Adderall XR prescription be sent to the [facility] in [place], [place] as she stated her [facility] on [address] in [place] was temporarily closed.  Agreed to send request to Dr. Toni Amend after calling to verify the [facility] on [address] Is "temporarily closed" and the order could not be transferred.

## 2020-05-18 ENCOUNTER — Other Ambulatory Visit: Payer: Self-pay

## 2020-05-18 ENCOUNTER — Telehealth (INDEPENDENT_AMBULATORY_CARE_PROVIDER_SITE_OTHER): Payer: Medicare Other | Admitting: Psychiatry

## 2020-05-18 DIAGNOSIS — F901 Attention-deficit hyperactivity disorder, predominantly hyperactive type: Secondary | ICD-10-CM | POA: Diagnosis not present

## 2020-05-18 DIAGNOSIS — F411 Generalized anxiety disorder: Secondary | ICD-10-CM

## 2020-05-18 MED ORDER — AMPHETAMINE-DEXTROAMPHET ER 30 MG PO CP24: 30.0000 mg | ORAL_CAPSULE | Freq: Two times a day (BID) | ORAL | 0 refills | Status: DC

## 2020-05-18 NOTE — Progress Notes (Signed)
Virtual Visit via Telephone Note  I connected with Karina Cervantes on 05/18/20 at  2:00 PM EST by telephone and verified that I am speaking with the correct person using two identifiers.  Location: Patient: Home Provider: Hospital   I discussed the limitations, risks, security and privacy concerns of performing an evaluation and management service by telephone and the availability of in person appointments. I also discussed with the patient that there may be a patient responsible charge related to this service. The patient expressed understanding and agreed to proceed.   History of Present Illness: Patient contacted by phone.  She was on time and appropriate.  Speech is slurred as it has been since she had a stroke.  She has no new complaint.  She is specifically asking if she can fill her stimulant a couple days early because she is says she is going out of town.  Denies depression denies suicidal or homicidal ideation.  Denies hallucinations denies anger problems.  Says she is still eating and sleeping adequately.    Observations/Objective: Alert and oriented.  A little hard to understand but does not appear to be psychotic.  Sometimes a little loose with her affect but redirectable.  No evidence of acute dangerousness   Assessment and Plan: Review medication use and risks.  I did put in a new prescription at a local pharmacy to allow her to get the medicine filled a little early.  Otherwise continue current prescriptions.  Supportive counseling.  Follow-up 3 months   Follow Up Instructions: Follow-up as needed    I discussed the assessment and treatment plan with the patient. The patient was provided an opportunity to ask questions and all were answered. The patient agreed with the plan and demonstrated an understanding of the instructions.   The patient was advised to call back or seek an in-person evaluation if the symptoms worsen or if the condition fails to improve as anticipated.  I  provided 20 minutes of non-face-to-face time during this encounter.   Mordecai Rasmussen, MD

## 2020-06-03 ENCOUNTER — Other Ambulatory Visit: Payer: Self-pay | Admitting: Psychiatry

## 2020-06-03 MED ORDER — AMPHETAMINE-DEXTROAMPHET ER 30 MG PO CP24: 30.0000 mg | ORAL_CAPSULE | Freq: Two times a day (BID) | ORAL | 0 refills | Status: DC

## 2020-06-22 ENCOUNTER — Other Ambulatory Visit: Payer: Self-pay

## 2020-06-22 ENCOUNTER — Telehealth (INDEPENDENT_AMBULATORY_CARE_PROVIDER_SITE_OTHER): Payer: Medicare Other | Admitting: Psychiatry

## 2020-06-22 DIAGNOSIS — F901 Attention-deficit hyperactivity disorder, predominantly hyperactive type: Secondary | ICD-10-CM | POA: Diagnosis not present

## 2020-06-22 DIAGNOSIS — F411 Generalized anxiety disorder: Secondary | ICD-10-CM | POA: Diagnosis not present

## 2020-06-22 MED ORDER — AMPHETAMINE-DEXTROAMPHET ER 30 MG PO CP24: 30.0000 mg | ORAL_CAPSULE | Freq: Two times a day (BID) | ORAL | 0 refills | Status: DC

## 2020-06-22 NOTE — Progress Notes (Signed)
Virtual Visit via Telephone Note  I connected with Karina Cervantes on 06/22/20 at  1:20 PM EDT by telephone and verified that I am speaking with the correct person using two identifiers.  Location: Patient: Home Provider: Hospital   I discussed the limitations, risks, security and privacy concerns of performing an evaluation and management service by telephone and the availability of in person appointments. I also discussed with the patient that there may be a patient responsible charge related to this service. The patient expressed understanding and agreed to proceed.   History of Present Illness: Reach patient by telephone.  Initially as is often the case she was difficult to understand but when I ask her to slow down she was able to do it until it was easy to understand her.  She has no new complaints.  We reviewed the recent need to have 2 different prescriptions for her Adderall because the pharmacy did not have a full month supply.  I explained that that is just the fact of the law.  Patient has no new symptoms.  Mood is been stable.  No anger problems no depression.  Eating okay sleeping okay.  Claims her blood pressure is under control.  No evidence of psychosis.  Tolerating medicine well and finds it still helpful with functioning focus and concentration    Observations/Objective: Patient was able to follow directions and it was much easier to tell that her affect was appropriate mood is reported as okay.  Thoughts lucid without any sign of delusions psychosis or loosening of association   Assessment and Plan: Stable.  Reviewed plan with the Adderall.  Refilled with 3 new prescriptions at the [facility] pharmacy at her request.   Follow Up Instructions: Follow-up 3 months    I discussed the assessment and treatment plan with the patient. The patient was provided an opportunity to ask questions and all were answered. The patient agreed with the plan and demonstrated an understanding  of the instructions.   The patient was advised to call back or seek an in-person evaluation if the symptoms worsen or if the condition fails to improve as anticipated.  I provided 20 minutes of non-face-to-face time during this encounter.   Mordecai Rasmussen, MD

## 2020-09-17 ENCOUNTER — Other Ambulatory Visit: Payer: Self-pay | Admitting: Psychiatry

## 2020-09-17 MED ORDER — AMPHETAMINE-DEXTROAMPHET ER 30 MG PO CP24: 30.0000 mg | ORAL_CAPSULE | Freq: Two times a day (BID) | ORAL | 0 refills | Status: DC

## 2020-09-21 ENCOUNTER — Telehealth (HOSPITAL_BASED_OUTPATIENT_CLINIC_OR_DEPARTMENT_OTHER): Payer: Medicare Other | Admitting: Psychiatry

## 2020-09-21 ENCOUNTER — Other Ambulatory Visit: Payer: Self-pay

## 2020-09-21 DIAGNOSIS — F901 Attention-deficit hyperactivity disorder, predominantly hyperactive type: Secondary | ICD-10-CM

## 2020-09-21 DIAGNOSIS — F411 Generalized anxiety disorder: Secondary | ICD-10-CM

## 2020-09-21 MED ORDER — AMPHETAMINE-DEXTROAMPHET ER 30 MG PO CP24: 30.0000 mg | ORAL_CAPSULE | Freq: Two times a day (BID) | ORAL | 0 refills | Status: DC

## 2020-09-21 MED ORDER — AMPHETAMINE-DEXTROAMPHET ER 30 MG PO CP24
30.0000 mg | ORAL_CAPSULE | Freq: Two times a day (BID) | ORAL | 0 refills | Status: DC
Start: 2020-09-21 — End: 2020-12-21

## 2020-09-21 NOTE — Progress Notes (Signed)
Virtual Visit via Telephone Note  I connected with Karina Cervantes on 09/21/20 at  1:00 PM EDT by telephone and verified that I am speaking with the correct person using two identifiers.  Location: Patient: Home Provider: Hospital   I discussed the limitations, risks, security and privacy concerns of performing an evaluation and management service by telephone and the availability of in person appointments. I also discussed with the patient that there may be a patient responsible charge related to this service. The patient expressed understanding and agreed to proceed.   History of Present Illness: Patient reached by phone for follow-up appointment.  She was on time and appropriate.  Has no new complaints.  State mood continues to do well.  Attention and focus are stable.  Denies any hallucinations or psychotic symptoms.  Denies any suicidal or homicidal thoughts.  Denies any problems with appetite or sleep.    Observations/Objective: Alert and oriented.  Communicative.  Affect euthymic.  Thoughts lucid no evidence of any acute dangerousness   Assessment and Plan: Reviewed medications and treatment plan.  Checked on [employer].  Last prescription filled 610.  To more put in so that the rhythm of 3 months will still be maintained and we can follow up then.   Follow Up Instructions: Follow-up 3 months    I discussed the assessment and treatment plan with the patient. The patient was provided an opportunity to ask questions and all were answered. The patient agreed with the plan and demonstrated an understanding of the instructions.   The patient was advised to call back or seek an in-person evaluation if the symptoms worsen or if the condition fails to improve as anticipated.  I provided 20 minutes of non-face-to-face time during this encounter.   Mordecai Rasmussen, MD

## 2020-10-11 ENCOUNTER — Other Ambulatory Visit: Payer: Self-pay

## 2020-10-11 ENCOUNTER — Emergency Department
Admission: EM | Admit: 2020-10-11 | Discharge: 2020-10-11 | Disposition: A | Discharge status: Home / Self Care | Payer: Medicare Other | Attending: Emergency Medicine | Admitting: Emergency Medicine

## 2020-10-11 ENCOUNTER — Encounter: Payer: Self-pay | Admitting: Emergency Medicine

## 2020-10-11 DIAGNOSIS — E1165 Type 2 diabetes mellitus with hyperglycemia: Secondary | ICD-10-CM | POA: Diagnosis not present

## 2020-10-11 DIAGNOSIS — R739 Hyperglycemia, unspecified: Secondary | ICD-10-CM

## 2020-10-11 DIAGNOSIS — Z7982 Long term (current) use of aspirin: Secondary | ICD-10-CM | POA: Insufficient documentation

## 2020-10-11 DIAGNOSIS — Z91040 Latex allergy status: Secondary | ICD-10-CM | POA: Diagnosis not present

## 2020-10-11 DIAGNOSIS — R1084 Generalized abdominal pain: Secondary | ICD-10-CM | POA: Diagnosis not present

## 2020-10-11 DIAGNOSIS — R509 Fever, unspecified: Secondary | ICD-10-CM | POA: Diagnosis not present

## 2020-10-11 DIAGNOSIS — Z87891 Personal history of nicotine dependence: Secondary | ICD-10-CM | POA: Insufficient documentation

## 2020-10-11 DIAGNOSIS — Z7984 Long term (current) use of oral hypoglycemic drugs: Secondary | ICD-10-CM | POA: Diagnosis not present

## 2020-10-11 DIAGNOSIS — M545 Low back pain, unspecified: Secondary | ICD-10-CM | POA: Insufficient documentation

## 2020-10-11 DIAGNOSIS — Z79899 Other long term (current) drug therapy: Secondary | ICD-10-CM | POA: Diagnosis not present

## 2020-10-11 DIAGNOSIS — M549 Dorsalgia, unspecified: Secondary | ICD-10-CM | POA: Diagnosis not present

## 2020-10-11 DIAGNOSIS — I10 Essential (primary) hypertension: Secondary | ICD-10-CM | POA: Diagnosis not present

## 2020-10-11 LAB — URINE DRUG SCREEN, QUALITATIVE (ARMC ONLY)
Amphetamines, Ur Screen: POSITIVE — AB
Barbiturates, Ur Screen: NOT DETECTED
Benzodiazepine, Ur Scrn: NOT DETECTED
Cannabinoid 50 Ng, Ur ~~LOC~~: NOT DETECTED
Cocaine Metabolite,Ur ~~LOC~~: NOT DETECTED
MDMA (Ecstasy)Ur Screen: NOT DETECTED
Methadone Scn, Ur: NOT DETECTED
Opiate, Ur Screen: NOT DETECTED
Phencyclidine (PCP) Ur S: NOT DETECTED
Tricyclic, Ur Screen: NOT DETECTED

## 2020-10-11 LAB — BASIC METABOLIC PANEL
Anion gap: 7 (ref 5–15)
BUN: 12 mg/dL (ref 6–20)
CO2: 26 mmol/L (ref 22–32)
Calcium: 9.1 mg/dL (ref 8.9–10.3)
Chloride: 94 mmol/L — ABNORMAL LOW (ref 98–111)
Creatinine, Ser: 0.78 mg/dL (ref 0.44–1.00)
GFR, Estimated: >60 mL/min (ref >60)
Glucose, Bld: 552 mg/dL (ref 70–99)
Potassium: 3.7 mmol/L (ref 3.5–5.1)
Sodium: 127 mmol/L — ABNORMAL LOW (ref 135–145)

## 2020-10-11 LAB — CBC
HCT: 36.7 % (ref 36.0–46.0)
Hemoglobin: 13.2 g/dL (ref 12.0–15.0)
MCH: 31 pg (ref 26.0–34.0)
MCHC: 36 g/dL (ref 30.0–36.0)
MCV: 86.2 fL (ref 80.0–100.0)
Platelets: 122 10*3/uL — ABNORMAL LOW (ref 150–400)
RBC: 4.26 MIL/uL (ref 3.87–5.11)
RDW: 11.7 % (ref 11.5–15.5)
WBC: 9.1 10*3/uL (ref 4.0–10.5)
nRBC: 0 % (ref 0.0–0.2)

## 2020-10-11 LAB — URINALYSIS, COMPLETE (UACMP) WITH MICROSCOPIC
Bacteria, UA: NONE SEEN
Bilirubin Urine: NEGATIVE
Glucose, UA: >=500 mg/dL — AB
Hgb urine dipstick: NEGATIVE
Ketones, ur: NEGATIVE mg/dL
Leukocytes,Ua: NEGATIVE
Nitrite: NEGATIVE
Protein, ur: NEGATIVE mg/dL
Specific Gravity, Urine: 1.03 (ref 1.005–1.030)
Squamous Epithelial / HPF: NONE SEEN (ref 0–5)
pH: 5 (ref 5.0–8.0)

## 2020-10-11 LAB — CBG MONITORING, ED
Glucose-Capillary: 560 mg/dL (ref 70–99)
Glucose-Capillary: 407 mg/dL — ABNORMAL HIGH (ref 70–99)
Glucose-Capillary: 363 mg/dL — ABNORMAL HIGH (ref 70–99)

## 2020-10-11 MED ORDER — INSULIN ASPART 100 UNIT/ML IJ SOLN
5.0000 [IU] | Freq: Once | INTRAMUSCULAR | Status: AC
  Administered 2020-10-11: 5 [IU] via SUBCUTANEOUS
  Filled 2020-10-11: qty 1

## 2020-10-11 MED ORDER — LACTATED RINGERS IV BOLUS
1000.0000 mL | Freq: Once | INTRAVENOUS | Status: AC
  Administered 2020-10-11: 1000 mL via INTRAVENOUS

## 2020-10-11 MED ORDER — ACETAMINOPHEN 500 MG PO TABS
1000.0000 mg | ORAL_TABLET | Freq: Once | ORAL | Status: AC
  Administered 2020-10-11: 1000 mg via ORAL
  Filled 2020-10-11: qty 2

## 2020-10-11 MED ORDER — KETOROLAC TROMETHAMINE 30 MG/ML IJ SOLN
15.0000 mg | Freq: Once | INTRAMUSCULAR | Status: AC
  Administered 2020-10-11: 15 mg via INTRAVENOUS
  Filled 2020-10-11: qty 1

## 2020-10-11 MED ORDER — INSULIN ASPART 100 UNIT/ML IJ SOLN
10.0000 [IU] | Freq: Once | INTRAMUSCULAR | Status: AC
  Administered 2020-10-11: 10 [IU] via INTRAVENOUS
  Filled 2020-10-11: qty 1

## 2020-10-11 NOTE — ED Notes (Signed)
Pt to ED from home via EMS through triage.  Dr Katrinka Blazing at bedside attempting to get history from pt. Pt is vague in her descriptions and speech is hard to understand. Pt states that EMS was called because her back hurts. Pt is groaning and turning in bed. Denies NVD, denies injuries, falls, abuse. Denies CP. LBM was this morning and was "hard".  Pt states has been feeling "dizzy". Labs have been drawn and sent.

## 2020-10-11 NOTE — ED Notes (Signed)
Pt to ER via EMS with c/o abdominal pain starting last night that has now moved into back.  Pt noted to have CBG of 596.  Pt is also hypertensive, hx of same, but does not take meds.

## 2020-10-11 NOTE — ED Triage Notes (Signed)
Pt via EMS from home. Pt has DM I. Pt states that she doesn't take anything for the DM. Pt is hysterical and crying during triage. Pt keeps repeating, "please help me." Pt also c/o lower back pain. Pt also states she is thirsty and nauseous.

## 2020-10-11 NOTE — ED Provider Notes (Signed)
[facility] Emergency Department Provider Note ____________________________________________   Event Date/Time   First MD Initiated Contact with Patient 10/11/20 1247     (approximate)  I have reviewed the triage vital signs and the nursing notes.  HISTORY  Chief Complaint Hyperglycemia   HPI Karina Cervantes is a 55 y.o. femalewho presents to the ED for evaluation of hyperglycemia.  Chart review indicates bipolar disorder and stroke.  Patient reports that she has medication for hypertension and diabetes at home, but she does not take her medicine.  She reports that it has been a few months since she has taken these medications, she has them at home, just does not want to take them.  She presents today to the ED for evaluation of worsening atraumatic back pain in the setting of generalized weakness and nausea, with explicit concerns for hypoglycemia.  She reports left-sided atraumatic lumbar back pain that has been progressively worsening over the past 1-2 days.  Denies any falls or injuries.  Denies any urinary symptoms or diarrhea.  Reports feeling weak in a generalized fashion and reports feeling nauseous without emesis or abdominal pain.  Denies chest pain, syncope, fevers, falls or trauma.  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 Authorizing Provider  amLODipine (NORVASC) 5 MG tablet Take 5 mg by mouth daily. 09/14/16   [provider]  amphetamine-dextroamphetamine (ADDERALL XR) 30 MG 24 hr capsule Take 1 capsule (30 mg total) by mouth 2 (two) times daily. 09/21/20   Clapacs, Madie Reno, MD  aspirin 81 MG chewable tablet Chew 81 mg by mouth daily.  12/25/16   [provider]  atorvastatin (LIPITOR) 80 MG tablet Take 80 mg by mouth daily. 09/14/16   [provider]  Blood Glucose Monitoring Suppl (ONE TOUCH ULTRA MINI) w/Device KIT 2 (two) times daily. for testing 08/30/16   [provider]  hydrochlorothiazide (HYDRODIURIL) 50 MG tablet Take 50 mg by mouth daily. 08/25/16   [provider]  lisinopril (PRINIVIL,ZESTRIL) 20 MG tablet Take 20 mg by mouth daily. 09/14/16   [provider]  metFORMIN (GLUCOPHAGE) 500 MG tablet Take 500 mg by mouth. 09/14/16 10/14/16  [provider]  ONE TOUCH ULTRA TEST test strip 2 (two) times daily. for testing 08/30/16   [provider]  [facility] DELICA LANCETS 48J MISC 2 (two) times daily. for testing 08/30/16   [provider]  promethazine (PHENERGAN) 12.5 MG tablet Take 1 tablet (12.5 mg total) by mouth every 8 (eight) hours as needed for nausea or vomiting. 11/30/18   Duffy Bruce, MD    Allergies Penicillins and Latex  Family History  Problem Relation Age of Onset   Stroke Mother  Hypertension Mother    Anxiety disorder Mother    Depression Mother    Heart attack Father    Hypertension Father    Bipolar disorder Sister    Heart attack Brother    Alcohol abuse Brother    Drug abuse Brother    Heart attack Brother    Alcohol abuse Brother    Drug abuse Brother     Social History Social History   Tobacco Use   Smoking status: Former    Packs/day: 0.50    Pack years: 0.00     Types: Cigarettes    Start date: 11/09/1994    Quit date: 07/09/2016    Years since quitting: 4.2   Smokeless tobacco: Never  Vaping Use   Vaping Use: Never used  Substance Use Topics   Alcohol use: No    Alcohol/week: 0.0 standard drinks   Drug use: No    Review of Systems  Constitutional: No fever/chills.  Positive generalized weakness. Eyes: No visual changes. ENT: No sore throat. Cardiovascular: Denies chest pain. Respiratory: Denies shortness of breath. Gastrointestinal: No abdominal pain.  no vomiting.  No diarrhea.  No constipation. Positive for nausea. Genitourinary: Negative for dysuria. Musculoskeletal: Positive for atraumatic lumbar back pain. Skin: Negative for rash. Neurological: Negative for headaches, focal weakness or numbness.  ____________________________________________   PHYSICAL EXAM:  VITAL SIGNS: Vitals:   10/11/20 1330 10/11/20 1400  BP: (!) 168/87 (!) 165/73  Pulse: 95 91  Resp: (!) 27 (!) 25  Temp:    SpO2: 99% 99%     Constitutional: Alert and oriented.  Laying on her right side and appears uncomfortable.  Conversational. Eyes: Conjunctivae are normal. PERRL. EOMI. Head: Atraumatic. Nose: No congestion/rhinnorhea. Mouth/Throat: Mucous membranes are moist.  Oropharynx non-erythematous. Neck: No stridor. No cervical spine tenderness to palpation. Cardiovascular: Normal rate, regular rhythm. Grossly normal heart sounds.  Good peripheral circulation. Respiratory: Normal respiratory effort.  No retractions. Lungs CTAB. Gastrointestinal: Soft , nondistended, nontender to palpation. No CVA tenderness. Musculoskeletal: No lower extremity tenderness nor edema.  No joint effusions. No signs of acute trauma. No signs of trauma to the back or any skin changes.  Poorly localizing tenderness to palpation throughout left-sided lumbar paraspinal back. Neurologic:  Normal speech and language. No gross focal neurologic deficits are appreciated.  Skin:  Skin  is warm, dry and intact. No rash noted. Psychiatric: Mood and affect are normal. Speech and behavior are normal.  ____________________________________________   LABS (all labs ordered are listed, but only abnormal results are displayed)  Labs Reviewed  BASIC METABOLIC PANEL - Abnormal; Notable for the following components:      Result Value   Sodium 127 (*)    Chloride 94 (*)    Glucose, Bld 552 (*)    All other components within normal limits  CBC - Abnormal; Notable for the following components:   Platelets 122 (*)    All other components within normal limits  CBG MONITORING, ED - Abnormal; Notable for the following components:   Glucose-Capillary 560 (*)    All other components within normal limits  URINALYSIS, COMPLETE (UACMP) WITH MICROSCOPIC  BETA-HYDROXYBUTYRIC ACID  URINE DRUG SCREEN, QUALITATIVE ([facility] ONLY)  CBG MONITORING, ED  CBG MONITORING, ED   ____________________________________________  12 Lead EKG  Sinus rhythm, rate of 92 bpm.  Normal axis and intervals.  No evidence of acute ischemia. ____________________________________________  RADIOLOGY  ED MD interpretation:    Official radiology report(s): No results found.  ____________________________________________   PROCEDURES  and INTERVENTIONS  Procedure(s) performed (including Critical Care):  .1-3 Lead EKG Interpretation  Date/Time: 10/11/2020 2:37 PM Performed by: Vladimir Crofts, MD Authorized by: Vladimir Crofts, MD     Interpretation: normal     ECG rate:  94   ECG rate assessment: normal     Rhythm: sinus rhythm     Ectopy: none     Conduction: normal    Medications  lactated ringers bolus 1,000 mL (1,000 mLs Intravenous New Bag/Given 10/11/20 1333)  acetaminophen (TYLENOL) tablet 1,000 mg (1,000 mg Oral Given 10/11/20 1331)  ketorolac (TORADOL) 30 MG/ML injection 15 mg (15 mg Intravenous Given 10/11/20 1330)  insulin aspart (novoLOG) injection 10 Units (10 Units Intravenous Given 10/11/20 1426)     ____________________________________________   MDM / ED COURSE   55 year old woman presents to the ED with few days of generalized feeling unwell and atraumatic back pain, with evidence of hyperglycemia without acidosis.  She is without distress, though appears uncomfortable laying on her side and complaining of back pain.  No signs of trauma to the back, skin changes.  No neurologic or vascular deficits.  Otherwise benign examination.  Blood work with hyperglycemia without acidosis to suggest HHS or DKA.  We will provide IV fluids and small dose of IV insulin to help correct this.  Further will provide multimodal nonnarcotic analgesia due to her atraumatic lumbar pain without neurologic deficits, no indications for imaging.  Patient will be signed out to oncoming provider to follow-up on repeat CBG with expectations for likely outpatient management.  Clinical Course as of 10/11/20 1437  Mon Oct 11, 2020  1424 Reassessed.  Patient resting comfortably. [DS]    Clinical Course User Index [DS] Vladimir Crofts, MD    ____________________________________________   FINAL CLINICAL IMPRESSION(S) / ED DIAGNOSES  Final diagnoses:  Hyperglycemia  Lumbar back pain     ED Discharge Orders     None        Valiant Dills Tamala Julian   Note:  This document was prepared using Dragon voice recognition software and may include unintentional dictation errors.    Vladimir Crofts, MD 10/11/20 [phone]

## 2020-10-11 NOTE — Discharge Instructions (Addendum)
Please start taking your medicine for your sugars and blood pressure.

## 2020-10-11 NOTE — ED Provider Notes (Signed)
Vitals:   10/11/20 1600 10/11/20 1730  BP: (!) 158/78 (!) 180/75  Pulse: 85 74  Resp: 19 18  Temp:    SpO2: 95% 94%     Patient resting.  Blood glucose notably improved now.  Patient appropriate for discharge without evidence of acute or critical complication from hyperglycemia.  Stable for discharge.   Sharyn Creamer, MD 10/11/20 1750

## 2020-10-11 NOTE — ED Notes (Signed)
Called friend Gerlene Burdock to come pick up. He will bring clothes for pt and will arrive within 10 min with clothes then pt will discharge.

## 2020-10-11 NOTE — ED Notes (Signed)
CBG checked early per verbal order Dr Fanny Bien. CBG found to be 407. Boyfriend at bedside.

## 2020-11-28 DIAGNOSIS — R5383 Other fatigue: Secondary | ICD-10-CM | POA: Diagnosis not present

## 2020-11-28 DIAGNOSIS — N63 Unspecified lump in unspecified breast: Secondary | ICD-10-CM | POA: Diagnosis not present

## 2020-11-28 DIAGNOSIS — R4182 Altered mental status, unspecified: Secondary | ICD-10-CM | POA: Diagnosis not present

## 2020-11-28 DIAGNOSIS — I499 Cardiac arrhythmia, unspecified: Secondary | ICD-10-CM | POA: Diagnosis not present

## 2020-11-28 DIAGNOSIS — F32A Depression, unspecified: Secondary | ICD-10-CM | POA: Diagnosis not present

## 2020-11-28 DIAGNOSIS — R011 Cardiac murmur, unspecified: Secondary | ICD-10-CM | POA: Diagnosis not present

## 2020-11-28 DIAGNOSIS — Z20822 Contact with and (suspected) exposure to covid-19: Secondary | ICD-10-CM | POA: Diagnosis not present

## 2020-11-28 DIAGNOSIS — R140 Abdominal distension (gaseous): Secondary | ICD-10-CM | POA: Diagnosis not present

## 2020-11-28 DIAGNOSIS — R531 Weakness: Secondary | ICD-10-CM | POA: Diagnosis not present

## 2020-11-28 DIAGNOSIS — E872 Acidosis: Secondary | ICD-10-CM | POA: Diagnosis not present

## 2020-11-29 DIAGNOSIS — N6311 Unspecified lump in the right breast, upper outer quadrant: Secondary | ICD-10-CM | POA: Diagnosis not present

## 2020-11-29 DIAGNOSIS — I10 Essential (primary) hypertension: Secondary | ICD-10-CM | POA: Diagnosis not present

## 2020-11-29 DIAGNOSIS — E119 Type 2 diabetes mellitus without complications: Secondary | ICD-10-CM | POA: Diagnosis not present

## 2020-11-29 DIAGNOSIS — R4781 Slurred speech: Secondary | ICD-10-CM | POA: Diagnosis not present

## 2020-11-29 DIAGNOSIS — E872 Acidosis: Secondary | ICD-10-CM | POA: Diagnosis not present

## 2020-11-29 DIAGNOSIS — N63 Unspecified lump in unspecified breast: Secondary | ICD-10-CM | POA: Diagnosis not present

## 2020-11-29 DIAGNOSIS — N6341 Unspecified lump in right breast, subareolar: Secondary | ICD-10-CM | POA: Diagnosis not present

## 2020-11-29 DIAGNOSIS — Z8673 Personal history of transient ischemic attack (TIA), and cerebral infarction without residual deficits: Secondary | ICD-10-CM | POA: Diagnosis not present

## 2020-11-29 DIAGNOSIS — C50911 Malignant neoplasm of unspecified site of right female breast: Secondary | ICD-10-CM | POA: Diagnosis not present

## 2020-11-29 DIAGNOSIS — C773 Secondary and unspecified malignant neoplasm of axilla and upper limb lymph nodes: Secondary | ICD-10-CM | POA: Diagnosis not present

## 2020-11-29 DIAGNOSIS — R32 Unspecified urinary incontinence: Secondary | ICD-10-CM | POA: Diagnosis not present

## 2020-11-29 DIAGNOSIS — R4182 Altered mental status, unspecified: Secondary | ICD-10-CM | POA: Diagnosis not present

## 2020-11-29 DIAGNOSIS — N6310 Unspecified lump in the right breast, unspecified quadrant: Secondary | ICD-10-CM | POA: Diagnosis not present

## 2020-11-29 DIAGNOSIS — R590 Localized enlarged lymph nodes: Secondary | ICD-10-CM | POA: Diagnosis not present

## 2020-11-30 DIAGNOSIS — R4182 Altered mental status, unspecified: Secondary | ICD-10-CM | POA: Diagnosis not present

## 2020-11-30 DIAGNOSIS — R410 Disorientation, unspecified: Secondary | ICD-10-CM | POA: Diagnosis not present

## 2020-11-30 DIAGNOSIS — R591 Generalized enlarged lymph nodes: Secondary | ICD-10-CM | POA: Diagnosis not present

## 2020-11-30 DIAGNOSIS — Z8659 Personal history of other mental and behavioral disorders: Secondary | ICD-10-CM | POA: Diagnosis not present

## 2020-11-30 DIAGNOSIS — N63 Unspecified lump in unspecified breast: Secondary | ICD-10-CM | POA: Diagnosis not present

## 2020-11-30 DIAGNOSIS — I10 Essential (primary) hypertension: Secondary | ICD-10-CM | POA: Diagnosis not present

## 2020-11-30 DIAGNOSIS — J90 Pleural effusion, not elsewhere classified: Secondary | ICD-10-CM | POA: Diagnosis not present

## 2020-11-30 DIAGNOSIS — M898X9 Other specified disorders of bone, unspecified site: Secondary | ICD-10-CM | POA: Diagnosis not present

## 2020-11-30 DIAGNOSIS — K7689 Other specified diseases of liver: Secondary | ICD-10-CM | POA: Diagnosis not present

## 2020-11-30 DIAGNOSIS — R161 Splenomegaly, not elsewhere classified: Secondary | ICD-10-CM | POA: Diagnosis not present

## 2020-11-30 DIAGNOSIS — N6310 Unspecified lump in the right breast, unspecified quadrant: Secondary | ICD-10-CM | POA: Diagnosis not present

## 2020-11-30 DIAGNOSIS — E119 Type 2 diabetes mellitus without complications: Secondary | ICD-10-CM | POA: Diagnosis not present

## 2020-11-30 DIAGNOSIS — C50911 Malignant neoplasm of unspecified site of right female breast: Secondary | ICD-10-CM | POA: Diagnosis not present

## 2020-11-30 DIAGNOSIS — Z8673 Personal history of transient ischemic attack (TIA), and cerebral infarction without residual deficits: Secondary | ICD-10-CM | POA: Diagnosis not present

## 2020-11-30 DIAGNOSIS — C7951 Secondary malignant neoplasm of bone: Secondary | ICD-10-CM | POA: Diagnosis not present

## 2020-11-30 DIAGNOSIS — R32 Unspecified urinary incontinence: Secondary | ICD-10-CM | POA: Diagnosis not present

## 2020-12-01 DIAGNOSIS — R32 Unspecified urinary incontinence: Secondary | ICD-10-CM | POA: Diagnosis not present

## 2020-12-01 DIAGNOSIS — C7951 Secondary malignant neoplasm of bone: Secondary | ICD-10-CM | POA: Diagnosis not present

## 2020-12-01 DIAGNOSIS — M898X9 Other specified disorders of bone, unspecified site: Secondary | ICD-10-CM | POA: Diagnosis not present

## 2020-12-01 DIAGNOSIS — E119 Type 2 diabetes mellitus without complications: Secondary | ICD-10-CM | POA: Diagnosis not present

## 2020-12-01 DIAGNOSIS — C50911 Malignant neoplasm of unspecified site of right female breast: Secondary | ICD-10-CM | POA: Diagnosis not present

## 2020-12-01 DIAGNOSIS — R161 Splenomegaly, not elsewhere classified: Secondary | ICD-10-CM | POA: Diagnosis not present

## 2020-12-01 DIAGNOSIS — F32A Depression, unspecified: Secondary | ICD-10-CM | POA: Diagnosis not present

## 2020-12-01 DIAGNOSIS — N63 Unspecified lump in unspecified breast: Secondary | ICD-10-CM | POA: Diagnosis not present

## 2020-12-01 DIAGNOSIS — R4781 Slurred speech: Secondary | ICD-10-CM | POA: Diagnosis not present

## 2020-12-01 DIAGNOSIS — I10 Essential (primary) hypertension: Secondary | ICD-10-CM | POA: Diagnosis not present

## 2020-12-01 DIAGNOSIS — R591 Generalized enlarged lymph nodes: Secondary | ICD-10-CM | POA: Diagnosis not present

## 2020-12-01 DIAGNOSIS — R4182 Altered mental status, unspecified: Secondary | ICD-10-CM | POA: Diagnosis not present

## 2020-12-01 DIAGNOSIS — Z8673 Personal history of transient ischemic attack (TIA), and cerebral infarction without residual deficits: Secondary | ICD-10-CM | POA: Diagnosis not present

## 2020-12-02 DIAGNOSIS — R4781 Slurred speech: Secondary | ICD-10-CM | POA: Diagnosis not present

## 2020-12-02 DIAGNOSIS — F32A Depression, unspecified: Secondary | ICD-10-CM | POA: Diagnosis not present

## 2020-12-02 DIAGNOSIS — I10 Essential (primary) hypertension: Secondary | ICD-10-CM | POA: Diagnosis not present

## 2020-12-02 DIAGNOSIS — R4182 Altered mental status, unspecified: Secondary | ICD-10-CM | POA: Diagnosis not present

## 2020-12-02 DIAGNOSIS — E119 Type 2 diabetes mellitus without complications: Secondary | ICD-10-CM | POA: Diagnosis not present

## 2020-12-02 DIAGNOSIS — R410 Disorientation, unspecified: Secondary | ICD-10-CM | POA: Diagnosis not present

## 2020-12-02 DIAGNOSIS — N6310 Unspecified lump in the right breast, unspecified quadrant: Secondary | ICD-10-CM | POA: Diagnosis not present

## 2020-12-03 DIAGNOSIS — R4182 Altered mental status, unspecified: Secondary | ICD-10-CM | POA: Diagnosis not present

## 2020-12-03 DIAGNOSIS — E118 Type 2 diabetes mellitus with unspecified complications: Secondary | ICD-10-CM | POA: Diagnosis not present

## 2020-12-03 DIAGNOSIS — I10 Essential (primary) hypertension: Secondary | ICD-10-CM | POA: Diagnosis not present

## 2020-12-03 DIAGNOSIS — C50919 Malignant neoplasm of unspecified site of unspecified female breast: Secondary | ICD-10-CM | POA: Diagnosis not present

## 2020-12-04 DIAGNOSIS — R4182 Altered mental status, unspecified: Secondary | ICD-10-CM | POA: Diagnosis not present

## 2020-12-04 DIAGNOSIS — I10 Essential (primary) hypertension: Secondary | ICD-10-CM | POA: Diagnosis not present

## 2020-12-04 DIAGNOSIS — C50919 Malignant neoplasm of unspecified site of unspecified female breast: Secondary | ICD-10-CM | POA: Diagnosis not present

## 2020-12-04 DIAGNOSIS — E118 Type 2 diabetes mellitus with unspecified complications: Secondary | ICD-10-CM | POA: Diagnosis not present

## 2020-12-06 DIAGNOSIS — K760 Fatty (change of) liver, not elsewhere classified: Secondary | ICD-10-CM | POA: Diagnosis not present

## 2020-12-06 DIAGNOSIS — Z87891 Personal history of nicotine dependence: Secondary | ICD-10-CM | POA: Diagnosis not present

## 2020-12-06 DIAGNOSIS — Z171 Estrogen receptor negative status [ER-]: Secondary | ICD-10-CM | POA: Diagnosis not present

## 2020-12-06 DIAGNOSIS — R4182 Altered mental status, unspecified: Secondary | ICD-10-CM | POA: Diagnosis not present

## 2020-12-06 DIAGNOSIS — G4700 Insomnia, unspecified: Secondary | ICD-10-CM | POA: Diagnosis not present

## 2020-12-06 DIAGNOSIS — Z9049 Acquired absence of other specified parts of digestive tract: Secondary | ICD-10-CM | POA: Diagnosis not present

## 2020-12-06 DIAGNOSIS — Z7984 Long term (current) use of oral hypoglycemic drugs: Secondary | ICD-10-CM | POA: Diagnosis not present

## 2020-12-06 DIAGNOSIS — I69315 Cognitive social or emotional deficit following cerebral infarction: Secondary | ICD-10-CM | POA: Diagnosis not present

## 2020-12-06 DIAGNOSIS — Z9181 History of falling: Secondary | ICD-10-CM | POA: Diagnosis not present

## 2020-12-06 DIAGNOSIS — J45909 Unspecified asthma, uncomplicated: Secondary | ICD-10-CM | POA: Diagnosis not present

## 2020-12-06 DIAGNOSIS — C50911 Malignant neoplasm of unspecified site of right female breast: Secondary | ICD-10-CM | POA: Diagnosis not present

## 2020-12-06 DIAGNOSIS — I10 Essential (primary) hypertension: Secondary | ICD-10-CM | POA: Diagnosis not present

## 2020-12-06 DIAGNOSIS — E119 Type 2 diabetes mellitus without complications: Secondary | ICD-10-CM | POA: Diagnosis not present

## 2020-12-06 DIAGNOSIS — C7951 Secondary malignant neoplasm of bone: Secondary | ICD-10-CM | POA: Diagnosis not present

## 2020-12-06 DIAGNOSIS — E785 Hyperlipidemia, unspecified: Secondary | ICD-10-CM | POA: Diagnosis not present

## 2020-12-06 DIAGNOSIS — Z90710 Acquired absence of both cervix and uterus: Secondary | ICD-10-CM | POA: Diagnosis not present

## 2020-12-06 DIAGNOSIS — F909 Attention-deficit hyperactivity disorder, unspecified type: Secondary | ICD-10-CM | POA: Diagnosis not present

## 2020-12-06 DIAGNOSIS — F32A Depression, unspecified: Secondary | ICD-10-CM | POA: Diagnosis not present

## 2020-12-13 DIAGNOSIS — E785 Hyperlipidemia, unspecified: Secondary | ICD-10-CM | POA: Diagnosis not present

## 2020-12-13 DIAGNOSIS — R918 Other nonspecific abnormal finding of lung field: Secondary | ICD-10-CM | POA: Diagnosis not present

## 2020-12-13 DIAGNOSIS — Z87891 Personal history of nicotine dependence: Secondary | ICD-10-CM | POA: Diagnosis not present

## 2020-12-13 DIAGNOSIS — J811 Chronic pulmonary edema: Secondary | ICD-10-CM | POA: Diagnosis not present

## 2020-12-13 DIAGNOSIS — R109 Unspecified abdominal pain: Secondary | ICD-10-CM | POA: Diagnosis not present

## 2020-12-13 DIAGNOSIS — F988 Other specified behavioral and emotional disorders with onset usually occurring in childhood and adolescence: Secondary | ICD-10-CM | POA: Diagnosis not present

## 2020-12-13 DIAGNOSIS — Z853 Personal history of malignant neoplasm of breast: Secondary | ICD-10-CM | POA: Diagnosis not present

## 2020-12-13 DIAGNOSIS — Z20822 Contact with and (suspected) exposure to covid-19: Secondary | ICD-10-CM | POA: Diagnosis not present

## 2020-12-13 DIAGNOSIS — D0511 Intraductal carcinoma in situ of right breast: Secondary | ICD-10-CM | POA: Diagnosis not present

## 2020-12-13 DIAGNOSIS — R1032 Left lower quadrant pain: Secondary | ICD-10-CM | POA: Diagnosis not present

## 2020-12-13 DIAGNOSIS — Z8673 Personal history of transient ischemic attack (TIA), and cerebral infarction without residual deficits: Secondary | ICD-10-CM | POA: Diagnosis not present

## 2020-12-13 DIAGNOSIS — F32A Depression, unspecified: Secondary | ICD-10-CM | POA: Diagnosis not present

## 2020-12-13 DIAGNOSIS — Z794 Long term (current) use of insulin: Secondary | ICD-10-CM | POA: Diagnosis not present

## 2020-12-13 DIAGNOSIS — I10 Essential (primary) hypertension: Secondary | ICD-10-CM | POA: Diagnosis not present

## 2020-12-13 DIAGNOSIS — J90 Pleural effusion, not elsewhere classified: Secondary | ICD-10-CM | POA: Diagnosis not present

## 2020-12-13 DIAGNOSIS — R0602 Shortness of breath: Secondary | ICD-10-CM | POA: Diagnosis not present

## 2020-12-13 DIAGNOSIS — Z79899 Other long term (current) drug therapy: Secondary | ICD-10-CM | POA: Diagnosis not present

## 2020-12-13 DIAGNOSIS — Z880 Allergy status to penicillin: Secondary | ICD-10-CM | POA: Diagnosis not present

## 2020-12-13 DIAGNOSIS — R1907 Generalized intra-abdominal and pelvic swelling, mass and lump: Secondary | ICD-10-CM | POA: Diagnosis not present

## 2020-12-13 DIAGNOSIS — K760 Fatty (change of) liver, not elsewhere classified: Secondary | ICD-10-CM | POA: Diagnosis not present

## 2020-12-13 DIAGNOSIS — Z91040 Latex allergy status: Secondary | ICD-10-CM | POA: Diagnosis not present

## 2020-12-13 DIAGNOSIS — C50811 Malignant neoplasm of overlapping sites of right female breast: Secondary | ICD-10-CM | POA: Diagnosis not present

## 2020-12-13 DIAGNOSIS — J45909 Unspecified asthma, uncomplicated: Secondary | ICD-10-CM | POA: Diagnosis not present

## 2020-12-13 DIAGNOSIS — R7401 Elevation of levels of liver transaminase levels: Secondary | ICD-10-CM | POA: Diagnosis not present

## 2020-12-13 DIAGNOSIS — Z171 Estrogen receptor negative status [ER-]: Secondary | ICD-10-CM | POA: Diagnosis not present

## 2020-12-13 DIAGNOSIS — C50919 Malignant neoplasm of unspecified site of unspecified female breast: Secondary | ICD-10-CM | POA: Diagnosis not present

## 2020-12-13 DIAGNOSIS — K7460 Unspecified cirrhosis of liver: Secondary | ICD-10-CM | POA: Diagnosis not present

## 2020-12-13 DIAGNOSIS — R194 Change in bowel habit: Secondary | ICD-10-CM | POA: Diagnosis not present

## 2020-12-13 DIAGNOSIS — C7951 Secondary malignant neoplasm of bone: Secondary | ICD-10-CM | POA: Diagnosis not present

## 2020-12-13 DIAGNOSIS — F900 Attention-deficit hyperactivity disorder, predominantly inattentive type: Secondary | ICD-10-CM | POA: Diagnosis not present

## 2020-12-13 DIAGNOSIS — R180 Malignant ascites: Secondary | ICD-10-CM | POA: Diagnosis not present

## 2020-12-13 DIAGNOSIS — R9341 Abnormal radiologic findings on diagnostic imaging of renal pelvis, ureter, or bladder: Secondary | ICD-10-CM | POA: Diagnosis not present

## 2020-12-13 DIAGNOSIS — R188 Other ascites: Secondary | ICD-10-CM | POA: Diagnosis not present

## 2020-12-13 DIAGNOSIS — C50011 Malignant neoplasm of nipple and areola, right female breast: Secondary | ICD-10-CM | POA: Diagnosis not present

## 2020-12-13 DIAGNOSIS — R000 Tachycardia, unspecified: Secondary | ICD-10-CM | POA: Diagnosis not present

## 2020-12-13 DIAGNOSIS — Z7982 Long term (current) use of aspirin: Secondary | ICD-10-CM | POA: Diagnosis not present

## 2020-12-13 DIAGNOSIS — M79601 Pain in right arm: Secondary | ICD-10-CM | POA: Diagnosis not present

## 2020-12-13 DIAGNOSIS — E119 Type 2 diabetes mellitus without complications: Secondary | ICD-10-CM | POA: Diagnosis not present

## 2020-12-13 DIAGNOSIS — R0600 Dyspnea, unspecified: Secondary | ICD-10-CM | POA: Diagnosis not present

## 2020-12-13 DIAGNOSIS — M7989 Other specified soft tissue disorders: Secondary | ICD-10-CM | POA: Diagnosis not present

## 2020-12-13 DIAGNOSIS — R161 Splenomegaly, not elsewhere classified: Secondary | ICD-10-CM | POA: Diagnosis not present

## 2020-12-14 DIAGNOSIS — C50919 Malignant neoplasm of unspecified site of unspecified female breast: Secondary | ICD-10-CM | POA: Diagnosis not present

## 2020-12-15 DIAGNOSIS — E1159 Type 2 diabetes mellitus with other circulatory complications: Secondary | ICD-10-CM | POA: Diagnosis not present

## 2020-12-15 DIAGNOSIS — G893 Neoplasm related pain (acute) (chronic): Secondary | ICD-10-CM | POA: Diagnosis not present

## 2020-12-15 DIAGNOSIS — C50911 Malignant neoplasm of unspecified site of right female breast: Secondary | ICD-10-CM | POA: Diagnosis not present

## 2020-12-15 DIAGNOSIS — I639 Cerebral infarction, unspecified: Secondary | ICD-10-CM | POA: Diagnosis not present

## 2020-12-15 DIAGNOSIS — K7460 Unspecified cirrhosis of liver: Secondary | ICD-10-CM | POA: Diagnosis not present

## 2020-12-15 DIAGNOSIS — C50919 Malignant neoplasm of unspecified site of unspecified female breast: Secondary | ICD-10-CM | POA: Diagnosis not present

## 2020-12-21 ENCOUNTER — Other Ambulatory Visit: Payer: Self-pay

## 2020-12-21 ENCOUNTER — Telehealth (HOSPITAL_BASED_OUTPATIENT_CLINIC_OR_DEPARTMENT_OTHER): Payer: Medicare Other | Admitting: Psychiatry

## 2020-12-21 DIAGNOSIS — F411 Generalized anxiety disorder: Secondary | ICD-10-CM

## 2020-12-21 DIAGNOSIS — F901 Attention-deficit hyperactivity disorder, predominantly hyperactive type: Secondary | ICD-10-CM

## 2020-12-21 MED ORDER — MIRTAZAPINE 45 MG PO TABS: 45.0000 mg | ORAL_TABLET | Freq: Every day | ORAL | 5 refills | Status: AC

## 2020-12-21 NOTE — Progress Notes (Signed)
Virtual Visit via Telephone Note  I connected with Karina Cervantes on 12/21/20 at  1:00 PM EDT by telephone and verified that I am speaking with the correct person using two identifiers.  Location: Patient: Home Provider: Hospital   I discussed the limitations, risks, security and privacy concerns of performing an evaluation and management service by telephone and the availability of in person appointments. I also discussed with the patient that there may be a patient responsible charge related to this service. The patient expressed understanding and agreed to proceed.   History of Present Illness: Follow-up for this 55 year old woman with chronic anxiety and ADHD.  Since our last talk patient has had severe medical changes.  She was in the hospital at [facility] and was diagnosed with breast cancer.  They contacted me at that time and were concerned about her weight loss.  Adderall was discontinued and Remeron was initiated.  Patient states that she is getting along okay.  Feels anxious and sad but denies suicidal thoughts.  Tolerating medicine well.    Observations/Objective: Voice is increasingly difficult to understand.  Slurring speech a lot.  Results of her stroke even more noticeable now   Assessment and Plan: Patient stable seems to be doing okay without the ADHD medicine.  Recommend continuing the Remeron for depression and sleep and weight gain.  Continue 45 mg at night.  Refilled prescription follow-up in another 3 months.   Follow Up Instructions:    I discussed the assessment and treatment plan with the patient. The patient was provided an opportunity to ask questions and all were answered. The patient agreed with the plan and demonstrated an understanding of the instructions.   The patient was advised to call back or seek an in-person evaluation if the symptoms worsen or if the condition fails to improve as anticipated.  I provided 20 minutes of non-face-to-face time during this  encounter.   Mordecai Rasmussen, MD

## 2021-01-08 DEATH — deceased

## 2022-08-14 ENCOUNTER — Encounter (HOSPITAL_COMMUNITY): Payer: Self-pay
